# Patient Record
Sex: Female | Born: 1985 | Race: White | Hispanic: No | Marital: Married | Smoking: Former smoker
Health system: Southern US, Community
[De-identification: ages and names within clinical notes are randomized; demographics above are authoritative.]

## PROBLEM LIST (undated history)

## (undated) DIAGNOSIS — F32A Depression, unspecified: Secondary | ICD-10-CM

## (undated) DIAGNOSIS — J45909 Unspecified asthma, uncomplicated: Secondary | ICD-10-CM

## (undated) DIAGNOSIS — S3720XA Unspecified injury of bladder, initial encounter: Secondary | ICD-10-CM

## (undated) DIAGNOSIS — K259 Gastric ulcer, unspecified as acute or chronic, without hemorrhage or perforation: Secondary | ICD-10-CM

## (undated) DIAGNOSIS — T8859XA Other complications of anesthesia, initial encounter: Secondary | ICD-10-CM

## (undated) DIAGNOSIS — F419 Anxiety disorder, unspecified: Secondary | ICD-10-CM

## (undated) DIAGNOSIS — Z87440 Personal history of urinary (tract) infections: Secondary | ICD-10-CM

## (undated) DIAGNOSIS — K219 Gastro-esophageal reflux disease without esophagitis: Secondary | ICD-10-CM

## (undated) HISTORY — DX: Gastric ulcer, unspecified as acute or chronic, without hemorrhage or perforation: K25.9

## (undated) HISTORY — DX: Anxiety disorder, unspecified: F41.9

## (undated) HISTORY — DX: Unspecified asthma, uncomplicated: J45.909

## (undated) HISTORY — PX: PARTIAL HYSTERECTOMY: SHX80

## (undated) HISTORY — PX: CHOLECYSTECTOMY: SHX55

## (undated) HISTORY — DX: Depression, unspecified: F32.A

## (undated) HISTORY — DX: Gastro-esophageal reflux disease without esophagitis: K21.9

## (undated) HISTORY — PX: APPENDECTOMY: SHX54

## (undated) HISTORY — PX: TONSILLECTOMY: SUR1361

---

## 2017-02-12 HISTORY — PX: BLADDER REPAIR: SHX76

## 2018-07-16 ENCOUNTER — Ambulatory Visit (INDEPENDENT_AMBULATORY_CARE_PROVIDER_SITE_OTHER): Payer: Medicaid Other | Admitting: Internal Medicine

## 2018-07-16 ENCOUNTER — Encounter (INDEPENDENT_AMBULATORY_CARE_PROVIDER_SITE_OTHER): Payer: Self-pay | Admitting: Internal Medicine

## 2018-07-16 ENCOUNTER — Other Ambulatory Visit: Payer: Self-pay

## 2018-07-16 VITALS — BP 102/72 | HR 74 | Temp 98.5°F | Ht 65.0 in | Wt 209.2 lb

## 2018-07-16 DIAGNOSIS — A048 Other specified bacterial intestinal infections: Secondary | ICD-10-CM | POA: Insufficient documentation

## 2018-07-16 DIAGNOSIS — K219 Gastro-esophageal reflux disease without esophagitis: Secondary | ICD-10-CM | POA: Diagnosis not present

## 2018-07-16 MED ORDER — OMEPRAZOLE 40 MG PO CPDR: 40.0000 mg | DELAYED_RELEASE_CAPSULE | Freq: Two times a day (BID) | ORAL | 3 refills | Status: DC

## 2018-07-16 NOTE — Patient Instructions (Signed)
EGD. The risks of bleeding, perforation and infection were reviewed with patient.  

## 2018-07-16 NOTE — Progress Notes (Signed)
   Subjective:    Patient ID: Angela Kent, female    DOB: [birth year], 33 y.o.   MRN: [id number]  HPI Referred by Angela Glatter PA-C for positive H. Pylori. She was covered with amoxicillin clarithromycin and omeprazole.  Saws Angela Kent 07/05/2018 with epigastric x 3 days.  Radiated into back. Nausea but no vomiting. Today she says the stomach pain is not as bad. She says she is having some dysphagia. She has an acid taste in her mouth. She has soreness in her esophagus. She does say her symptoms are better, though her esophagus is not better. Her appetite is okay. No weight loss. BMs move okay.  She does tell me that every since she had her hysterectomy last year, her esophagus has not felt right.     Review of Systems History reviewed. No pertinent past medical history.  History reviewed. No pertinent surgical history.  Allergies  Allergen Reactions  . Codeine     hives    Current Outpatient Medications on File Prior to Visit  Medication Sig Dispense Refill  . amoxicillin (AMOXIL) 500 MG tablet Take 500 mg by mouth 2 (two) times daily.    . clarithromycin (BIAXIN) 250 MG tablet Take 250 mg by mouth 2 (two) times daily.    Marland Kitchen omeprazole (PRILOSEC) 20 MG capsule Take 20 mg by mouth daily.     No current facility-administered medications on file prior to visit.         Objective:   Physical Exam Blood pressure 102/72, pulse 74, temperature 98.5 F (36.9 C), height 5\' 5"  (1.651 m), weight 209 lb 3.2 oz (94.9 kg). Alert and oriented. Skin warm and dry. Oral mucosa is moist.   . Sclera anicteric, conjunctivae is pink. Thyroid not enlarged. No cervical lymphadenopathy. Lungs clear. Heart regular rate and rhythm.  Abdomen is soft. Bowel sounds are positive. No hepatomegaly. No abdominal masses felt. Slight epigastric tenderness.  No edema to lower extremities.          Assessment & Plan:  GERD. H. Pylori.  Will need an EGD. The risks of bleeding, perforation and infection were reviewed  with patient.

## 2018-07-17 ENCOUNTER — Other Ambulatory Visit (INDEPENDENT_AMBULATORY_CARE_PROVIDER_SITE_OTHER): Payer: Self-pay | Admitting: Internal Medicine

## 2018-07-17 ENCOUNTER — Encounter (INDEPENDENT_AMBULATORY_CARE_PROVIDER_SITE_OTHER): Payer: Self-pay | Admitting: *Deleted

## 2018-07-17 DIAGNOSIS — K219 Gastro-esophageal reflux disease without esophagitis: Secondary | ICD-10-CM | POA: Insufficient documentation

## 2018-07-17 DIAGNOSIS — A048 Other specified bacterial intestinal infections: Secondary | ICD-10-CM

## 2018-07-17 NOTE — Progress Notes (Signed)
EGD

## 2018-09-23 ENCOUNTER — Other Ambulatory Visit (HOSPITAL_COMMUNITY)
Admission: RE | Admit: 2018-09-23 | Discharge: 2018-09-23 | Disposition: A | Discharge status: Home / Self Care | Payer: Medicaid Other | Source: Ambulatory Visit | Attending: Internal Medicine | Admitting: Internal Medicine

## 2018-09-23 ENCOUNTER — Other Ambulatory Visit: Payer: Self-pay

## 2018-09-23 DIAGNOSIS — Z01812 Encounter for preprocedural laboratory examination: Secondary | ICD-10-CM | POA: Diagnosis not present

## 2018-09-23 DIAGNOSIS — Z20828 Contact with and (suspected) exposure to other viral communicable diseases: Secondary | ICD-10-CM | POA: Diagnosis not present

## 2018-09-23 LAB — SARS CORONAVIRUS 2 (TAT 6-24 HRS): SARS Coronavirus 2: NEGATIVE

## 2018-09-25 ENCOUNTER — Ambulatory Visit (HOSPITAL_COMMUNITY)
Admission: RE | Admit: 2018-09-25 | Discharge: 2018-09-25 | Disposition: A | Discharge status: Home / Self Care | Payer: Medicaid Other | Attending: Internal Medicine | Admitting: Internal Medicine

## 2018-09-25 ENCOUNTER — Other Ambulatory Visit: Payer: Self-pay

## 2018-09-25 ENCOUNTER — Encounter (HOSPITAL_COMMUNITY): Payer: Self-pay | Admitting: *Deleted

## 2018-09-25 ENCOUNTER — Encounter (HOSPITAL_COMMUNITY)
Admission: RE | Disposition: A | Discharge status: Home / Self Care | Payer: Self-pay | Source: Home / Self Care | Attending: Internal Medicine

## 2018-09-25 DIAGNOSIS — A048 Other specified bacterial intestinal infections: Secondary | ICD-10-CM

## 2018-09-25 DIAGNOSIS — Z885 Allergy status to narcotic agent status: Secondary | ICD-10-CM | POA: Diagnosis not present

## 2018-09-25 DIAGNOSIS — K319 Disease of stomach and duodenum, unspecified: Secondary | ICD-10-CM | POA: Diagnosis not present

## 2018-09-25 DIAGNOSIS — Z9049 Acquired absence of other specified parts of digestive tract: Secondary | ICD-10-CM | POA: Diagnosis not present

## 2018-09-25 DIAGNOSIS — R12 Heartburn: Secondary | ICD-10-CM | POA: Diagnosis not present

## 2018-09-25 DIAGNOSIS — K228 Other specified diseases of esophagus: Secondary | ICD-10-CM | POA: Insufficient documentation

## 2018-09-25 DIAGNOSIS — K219 Gastro-esophageal reflux disease without esophagitis: Secondary | ICD-10-CM | POA: Diagnosis not present

## 2018-09-25 DIAGNOSIS — Z8619 Personal history of other infectious and parasitic diseases: Secondary | ICD-10-CM | POA: Insufficient documentation

## 2018-09-25 DIAGNOSIS — E669 Obesity, unspecified: Secondary | ICD-10-CM | POA: Insufficient documentation

## 2018-09-25 DIAGNOSIS — K2970 Gastritis, unspecified, without bleeding: Secondary | ICD-10-CM | POA: Diagnosis not present

## 2018-09-25 DIAGNOSIS — F17210 Nicotine dependence, cigarettes, uncomplicated: Secondary | ICD-10-CM | POA: Insufficient documentation

## 2018-09-25 DIAGNOSIS — R1013 Epigastric pain: Secondary | ICD-10-CM | POA: Diagnosis not present

## 2018-09-25 DIAGNOSIS — Z79899 Other long term (current) drug therapy: Secondary | ICD-10-CM | POA: Diagnosis not present

## 2018-09-25 DIAGNOSIS — Z7982 Long term (current) use of aspirin: Secondary | ICD-10-CM | POA: Diagnosis not present

## 2018-09-25 HISTORY — PX: ESOPHAGOGASTRODUODENOSCOPY: SHX5428

## 2018-09-25 HISTORY — PX: BIOPSY: SHX5522

## 2018-09-25 SURGERY — EGD (ESOPHAGOGASTRODUODENOSCOPY)
Anesthesia: Moderate Sedation

## 2018-09-25 MED ORDER — MEPERIDINE HCL 50 MG/ML IJ SOLN
INTRAMUSCULAR | Status: DC | PRN
  Administered 2018-09-25 (×2): 25 mg via INTRAVENOUS

## 2018-09-25 MED ORDER — MEPERIDINE HCL 50 MG/ML IJ SOLN
INTRAMUSCULAR | Status: AC
  Filled 2018-09-25: qty 1

## 2018-09-25 MED ORDER — MIDAZOLAM HCL 5 MG/5ML IJ SOLN
INTRAMUSCULAR | Status: AC
  Filled 2018-09-25: qty 10

## 2018-09-25 MED ORDER — MIDAZOLAM HCL 5 MG/5ML IJ SOLN
INTRAMUSCULAR | Status: DC | PRN
  Administered 2018-09-25 (×5): 2 mg via INTRAVENOUS

## 2018-09-25 MED ORDER — LIDOCAINE VISCOUS HCL 2 % MT SOLN
OROMUCOSAL | Status: DC | PRN
  Administered 2018-09-25: 1 via OROMUCOSAL

## 2018-09-25 MED ORDER — PANTOPRAZOLE SODIUM 40 MG PO TBEC: 40.0000 mg | DELAYED_RELEASE_TABLET | Freq: Every day | ORAL | 5 refills | Status: DC

## 2018-09-25 MED ORDER — LIDOCAINE VISCOUS HCL 2 % MT SOLN
OROMUCOSAL | Status: AC
  Filled 2018-09-25: qty 15

## 2018-09-25 MED ORDER — SODIUM CHLORIDE 0.9 % IV SOLN
INTRAVENOUS | Status: DC
  Administered 2018-09-25: 11:00:00 via INTRAVENOUS

## 2018-09-25 NOTE — Op Note (Signed)
[facility] Patient Name: Angela Kent Procedure Date: 09/25/2018 11:49 AM MRN: [id number] Date of Birth: 1985/05/03 Attending MD: Hildred Laser , MD CSN: [id number] Age: 33 Admit Type: Outpatient Procedure:                Upper GI endoscopy Indications:              Epigastric abdominal pain, Heartburn Providers:                Hildred Laser, MD, Charlsie Quest. Theda Sers RN, RN, Aram Candela Referring MD:              Medicines:                Lidocaine spray, Meperidine 50 mg IV, Midazolam 10                            mg IV Complications:            No immediate complications. Estimated Blood Loss:     Estimated blood loss was minimal. Procedure:                Pre-Anesthesia Assessment:                           - Prior to the procedure, a History and Physical                            was performed, and patient medications and                            allergies were reviewed. The patient's tolerance of                            previous anesthesia was also reviewed. The risks                            and benefits of the procedure and the sedation                            options and risks were discussed with the patient.                            All questions were answered, and informed consent                            was obtained. Prior Anticoagulants: The patient has                            taken no previous anticoagulant or antiplatelet                            agents except for aspirin. ASA Grade Assessment: I                            -  A normal, healthy patient. After reviewing the                            risks and benefits, the patient was deemed in                            satisfactory condition to undergo the procedure.                           After obtaining informed consent, the endoscope was                            passed under direct vision. Throughout the                            procedure, the patient's blood  pressure, pulse, and                            oxygen saturations were monitored continuously. The                            [id number]-[id number] ([id number] scope was introduced through the                            mouth, and advanced to the second part of duodenum.                            The upper GI endoscopy was accomplished without                            difficulty. The patient tolerated the procedure                            well. Scope In: 12:49:53 PM Scope Out: 12:57:02 PM Total Procedure Duration: 0 hours 7 minutes 9 seconds  Findings:      The examined esophagus was normal.      The Z-line was irregular and was found 36 cm from the incisors.      Patchy moderate inflammation characterized by congestion (edema),       erosions and erythema was found in the gastric antrum and in the       prepyloric region of the stomach. Biopsies were taken with a cold       forceps for histology.      The exam of the stomach was otherwise normal.      The duodenal bulb and second portion of the duodenum were normal. Impression:               - Normal esophagus.                           - Z-line irregular, 36 cm from the incisors.                           - Gastritis. Biopsied.                           -  Normal duodenal bulb and second portion of the                            duodenum. Moderate Sedation:      Moderate (conscious) sedation was administered by the endoscopy nurse       and supervised by the endoscopist. The following parameters were       monitored: oxygen saturation, heart rate, blood pressure, CO2       capnography and response to care. Total physician intraservice time was       12 minutes. Recommendation:           - Patient has a contact number available for                            emergencies. The signs and symptoms of potential                            delayed complications were discussed with the                            patient. Return to normal activities  tomorrow.                            Written discharge instructions were provided to the                            patient.                           - Resume previous diet today.                           - Continue present medications.Keep NSAID use to                            minimum.                           - Pantoprazole 40 mg po qam.                           - Await pathology results. Procedure Code(s):        --- Professional ---                           [phone], Esophagogastroduodenoscopy, flexible,                            transoral; with biopsy, single or multiple                           G0500, Moderate sedation services provided by the                            same physician or other qualified health care  professional performing a gastrointestinal                            endoscopic service that sedation supports,                            requiring the presence of an independent trained                            observer to assist in the monitoring of the                            patient's level of consciousness and physiological                            status; initial 15 minutes of intra-service time;                            patient age 88 years or older (additional time may                            be reported with [id number], as appropriate) Diagnosis Code(s):        --- Professional ---                           K22.8, Other specified diseases of esophagus                           K29.70, Gastritis, unspecified, without bleeding                           R10.13, Epigastric pain                           R12, Heartburn CPT copyright 2019 American Medical Association. All rights reserved. The codes documented in this report are preliminary and upon coder review may  be revised to meet current compliance requirements. Lionel DecemberNajeeb Leyla Soliz, MD Lionel DecemberNajeeb Sofia Jaquith, MD 09/25/2018 1:07:08 PM This report has been signed electronically. Number  of Addenda: 0

## 2018-09-25 NOTE — Discharge Instructions (Signed)
No aspirin or NSAIDs for 24 hours. Keep BC powder use to minimum. Take Tylenol up to 2 g/day on as-needed basis for headache. Pantoprazole 40 mg by mouth 30 minutes before breakfast daily. No driving for 24 hours. Physician  will call with biopsy results.   Upper Endoscopy, Adult, Care After This sheet gives you information about how to care for yourself after your procedure. Your health care provider may also give you more specific instructions. If you have problems or questions, contact your health care provider. What can I expect after the procedure? After the procedure, it is common to have:  A sore throat.  Mild stomach pain or discomfort.  Bloating.  Nausea. Follow these instructions at home:   Follow instructions from your health care provider about what to eat or drink after your procedure.  Return to your normal activities as told by your health care provider. Ask your health care provider what activities are safe for you.  Take over-the-counter and prescription medicines only as told by your health care provider.  Do not drive for 24 hours if you were given a sedative during your procedure.  Keep all follow-up visits as told by your health care provider. This is important. Contact a health care provider if you have:  A sore throat that lasts longer than one day.  Trouble swallowing. Get help right away if:  You vomit blood or your vomit looks like coffee grounds.  You have: ? A fever. ? Bloody, black, or tarry stools. ? A severe sore throat or you cannot swallow. ? Difficulty breathing. ? Severe pain in your chest or abdomen. Summary  After the procedure, it is common to have a sore throat, mild stomach discomfort, bloating, and nausea.  Do not drive for 24 hours if you were given a sedative during the procedure.  Follow instructions from your health care provider about what to eat or drink after your procedure.  Return to your normal activities as  told by your health care provider. This information is not intended to replace advice given to you by your health care provider. Make sure you discuss any questions you have with your health care provider. Document Released: 07/31/2011 Document Revised: 07/23/2017 Document Reviewed: 07/01/2017 Elsevier Patient Education  [place].  Gastritis, Adult  Gastritis is swelling (inflammation) of the stomach. Gastritis can develop quickly (acute). It can also develop slowly over time (chronic). It is important to get help for this condition. If you do not get help, your stomach can bleed, and you can get sores (ulcers) in your stomach. What are the causes? This condition may be caused by:  Germs that get to your stomach.  Drinking too much alcohol.  Medicines you are taking.  Too much acid in the stomach.  A disease of the intestines or stomach.  Stress.  An allergic reaction.  Crohn's disease.  Some cancer treatments (radiation). Sometimes the cause of this condition is not known. What are the signs or symptoms? Symptoms of this condition include:  Pain in your stomach.  A burning feeling in your stomach.  Feeling sick to your stomach (nauseous).  Throwing up (vomiting).  Feeling too full after you eat.  Weight loss.  Bad breath.  Throwing up blood.  Blood in your poop (stool). How is this diagnosed? This condition may be diagnosed with:  Your medical history and symptoms.  A physical exam.  Tests. These can include: ? Blood tests. ? Stool tests. ? A procedure to look  inside your stomach (upper endoscopy). ? A test in which a sample of tissue is taken for testing (biopsy). How is this treated? Treatment for this condition depends on what caused it. You may be given:  Antibiotic medicine, if your condition was caused by germs.  H2 blockers and similar medicines, if your condition was caused by too much acid. Follow these instructions at  home: Medicines  Take over-the-counter and prescription medicines only as told by your doctor.  If you were prescribed an antibiotic medicine, take it as told by your doctor. Do not stop taking it even if you start to feel better. Eating and drinking   Eat small meals often, instead of large meals.  Avoid foods and drinks that make your symptoms worse.  Drink enough fluid to keep your pee (urine) pale yellow. Alcohol use  Do not drink alcohol if: ? Your doctor tells you not to drink. ? You are pregnant, may be pregnant, or are planning to become pregnant.  If you drink alcohol: ? Limit your use to:  0-1 drink a day for women.  0-2 drinks a day for men. ? Be aware of how much alcohol is in your drink. In the [place] one drink equals one 12 oz bottle of beer (355 mL), one 5 oz glass of wine (148 mL), or one 1 oz glass of hard liquor (44 mL). General instructions  Talk with your doctor about ways to manage stress. You can exercise or do deep breathing, meditation, or yoga.  Do not smoke or use products that have nicotine or tobacco. If you need help quitting, ask your doctor.  Keep all follow-up visits as told by your doctor. This is important. Contact a doctor if:  Your symptoms get worse.  Your symptoms go away and then come back. Get help right away if:  You throw up blood or something that looks like coffee grounds.  You have black or dark red poop.  You throw up any time you try to drink fluids.  Your stomach pain gets worse.  You have a fever.  You do not feel better after one week. Summary  Gastritis is swelling (inflammation) of the stomach.  You must get help for this condition. If you do not get help, your stomach can bleed, and you can get sores (ulcers).  This condition is diagnosed with medical history, physical exam, or tests.  You can be treated with medicines for germs or medicines to block too much acid in your stomach. This information is not  intended to replace advice given to you by your health care provider. Make sure you discuss any questions you have with your health care provider. Document Released: 07/18/2007 Document Revised: 06/18/2017 Document Reviewed: 06/18/2017 Elsevier Patient Education  2020 [employer]

## 2018-09-25 NOTE — H&P (Signed)
Angela Kent is an 33 y.o. female.   Chief Complaint: Patient is here for EGD. HPI: Patient is 33 year old Caucasian female who has been experiencing epigastric pain over the last several weeks.  She has been tested and treated for H. pylori infection.  She is better but she still has pain.  Pain occurs when she is hungry and also after meals and may last for up to an hour.  Pain is not associated nausea vomiting melena or rectal bleeding.  Her appetite is good and she has not lost any weight.  She takes no more than 1 or 2 doses of Goody powders per week.  She has heartburn for 3 times a week and she is taking OTC NSAIDs.  She smokes 6 to 7 cigarettes/day.  She is trying to quit.  She does not drink alcohol but states she consume too much of caffeine-containing products.   Past medical history Obesity  Past Surgical History:  Procedure Laterality Date  . APPENDECTOMY    . CESAREAN SECTION    . CHOLECYSTECTOMY    . PARTIAL HYSTERECTOMY    . TONSILLECTOMY      History reviewed. No pertinent family history. Social History:  reports that she has been smoking. She has never used smokeless tobacco. She reports that she does not drink alcohol or use drugs.  Allergies:  Allergies  Allergen Reactions  . Codeine Hives    Medications Prior to Admission  Medication Sig Dispense Refill  . Aspirin-Acetaminophen-Caffeine (GOODY HEADACHE PO) Take 1 packet by mouth daily as needed (for headache).    . calcium carbonate (TUMS EX) 750 MG chewable tablet Chew 2 tablets by mouth 2 (two) times daily as needed for heartburn.      No results found for this or any previous visit (from the past 48 hour(s)). No results found.  ROS  Blood pressure 98/68, pulse 79, temperature 98.2 F (36.8 C), temperature source Oral, resp. rate 13, height 5\' 5"  (1.651 m), weight 90.7 kg, SpO2 99 %. Physical Exam  Constitutional: She appears well-developed and well-nourished.  HENT:  Mouth/Throat: Oropharynx is clear  and moist.  Eyes: Conjunctivae are normal. No scleral icterus.  Neck: No thyromegaly present.  GI:  Abdomen is full.  She has lower midline, appendectomy scar as well as 2 scars in upper abdomen.  Abdomen is soft and nontender with no organomegaly or masses.  Lymphadenopathy:    She has no cervical adenopathy.     Assessment/Plan Epigastric pain. History of H. pylori gastritis Diagnostic EGD.  Hildred Laser, MD 09/25/2018, 12:38 PM

## 2018-09-29 ENCOUNTER — Telehealth (INDEPENDENT_AMBULATORY_CARE_PROVIDER_SITE_OTHER): Payer: Self-pay | Admitting: Internal Medicine

## 2018-09-29 NOTE — Telephone Encounter (Signed)
Forwarded to Dr.Rehman. 

## 2018-09-29 NOTE — Telephone Encounter (Signed)
Patient called to get results from EGD done on 8-13  -  Ph# [phone]

## 2018-09-30 NOTE — Telephone Encounter (Signed)
Patient left message stating she spoke with you yesterday and would like a call back at [phone]

## 2018-09-30 NOTE — Telephone Encounter (Signed)
Patient call with biopsy results.

## 2018-10-02 NOTE — Telephone Encounter (Signed)
Provided phone number not functioning, pls see if there is an alternative phone #

## 2018-10-02 NOTE — Telephone Encounter (Signed)
I called provided phone number, call could not be completed, automated msg stated to try calling again later

## 2018-10-03 ENCOUNTER — Encounter (HOSPITAL_COMMUNITY): Payer: Self-pay | Admitting: Internal Medicine

## 2019-01-01 ENCOUNTER — Ambulatory Visit (INDEPENDENT_AMBULATORY_CARE_PROVIDER_SITE_OTHER): Payer: Medicaid Other | Admitting: Nurse Practitioner

## 2019-02-17 ENCOUNTER — Encounter (INDEPENDENT_AMBULATORY_CARE_PROVIDER_SITE_OTHER): Payer: Self-pay

## 2019-02-17 ENCOUNTER — Telehealth (INDEPENDENT_AMBULATORY_CARE_PROVIDER_SITE_OTHER): Payer: Self-pay | Admitting: Internal Medicine

## 2019-02-17 NOTE — Telephone Encounter (Signed)
Patient called about an appointment - was set up for the next available - stated she needs something for her Hpylori - please advise - [phone]

## 2019-02-17 NOTE — Telephone Encounter (Signed)
Dr.Rehman ask if Liborio Nixon could see the patient this Thursday, February 19 2019.

## 2019-02-19 ENCOUNTER — Ambulatory Visit (INDEPENDENT_AMBULATORY_CARE_PROVIDER_SITE_OTHER): Payer: Medicaid Other | Admitting: Gastroenterology

## 2019-02-19 ENCOUNTER — Other Ambulatory Visit: Payer: Self-pay

## 2019-02-19 ENCOUNTER — Encounter (INDEPENDENT_AMBULATORY_CARE_PROVIDER_SITE_OTHER): Payer: Self-pay | Admitting: Gastroenterology

## 2019-02-19 VITALS — BP 103/73 | HR 79 | Temp 97.3°F | Ht 60.0 in | Wt 208.1 lb

## 2019-02-19 DIAGNOSIS — Z8619 Personal history of other infectious and parasitic diseases: Secondary | ICD-10-CM | POA: Diagnosis not present

## 2019-02-19 DIAGNOSIS — R1013 Epigastric pain: Secondary | ICD-10-CM

## 2019-02-19 LAB — CBC WITH DIFFERENTIAL/PLATELET
Absolute Monocytes: 515 cells/uL (ref 200–950)
Basophils Absolute: 33 cells/uL (ref 0–200)
Basophils Relative: 0.4 %
Eosinophils Absolute: 299 cells/uL (ref 15–500)
Eosinophils Relative: 3.6 %
HCT: 45.9 % — ABNORMAL HIGH (ref 35.0–45.0)
Hemoglobin: 15.8 g/dL — ABNORMAL HIGH (ref 11.7–15.5)
Lymphs Abs: 2324 cells/uL (ref 850–3900)
MCH: 30.2 pg (ref 27.0–33.0)
MCHC: 34.4 g/dL (ref 32.0–36.0)
MCV: 87.8 fL (ref 80.0–100.0)
MPV: 10.6 fL (ref 7.5–12.5)
Monocytes Relative: 6.2 %
Neutro Abs: 5129 cells/uL (ref 1500–7800)
Neutrophils Relative %: 61.8 %
Platelets: 247 10*3/uL (ref 140–400)
RBC: 5.23 10*6/uL — ABNORMAL HIGH (ref 3.80–5.10)
RDW: 12.6 % (ref 11.0–15.0)
Total Lymphocyte: 28 %
WBC: 8.3 10*3/uL (ref 3.8–10.8)

## 2019-02-19 LAB — COMPLETE METABOLIC PANEL WITH GFR
AG Ratio: 1.4 (calc) (ref 1.0–2.5)
ALT: 28 U/L (ref 6–29)
AST: 34 U/L — ABNORMAL HIGH (ref 10–30)
Albumin: 4.1 g/dL (ref 3.6–5.1)
Alkaline phosphatase (APISO): 34 U/L (ref 31–125)
BUN: 14 mg/dL (ref 7–25)
CO2: 26 mmol/L (ref 20–32)
Calcium: 9.4 mg/dL (ref 8.6–10.2)
Chloride: 106 mmol/L (ref 98–110)
Creat: 0.58 mg/dL (ref 0.50–1.10)
GFR, Est African American: 142 mL/min/{1.73_m2} (ref >=60)
GFR, Est Non African American: 123 mL/min/{1.73_m2} (ref >=60)
Globulin: 3 g/dL (calc) (ref 1.9–3.7)
Glucose, Bld: 86 mg/dL (ref 65–99)
Potassium: 4.2 mmol/L (ref 3.5–5.3)
Sodium: 140 mmol/L (ref 135–146)
Total Bilirubin: 0.6 mg/dL (ref 0.2–1.2)
Total Protein: 7.1 g/dL (ref 6.1–8.1)

## 2019-02-19 LAB — AMYLASE: Amylase: 41 U/L (ref 21–101)

## 2019-02-19 LAB — LIPASE: Lipase: 17 U/L (ref 7–60)

## 2019-02-19 MED ORDER — PANTOPRAZOLE SODIUM 40 MG PO TBEC: 40.0000 mg | DELAYED_RELEASE_TABLET | Freq: Two times a day (BID) | ORAL | 3 refills | Status: DC

## 2019-02-19 NOTE — Progress Notes (Addendum)
Patient profile: Angela Kent is a 34 y.o. female seen for evaluation of abdominal pain. Last seen in clinic on August 2020 for EGD.  She was diagnosed with H. pylori in May 2020, she was treated with triple therapy with amoxicillin clindamycin PPI.   She had an EGD in August without evidence of  H. Pylori(antral biopsy negative for H. Pylori).  History of Present Illness: Angela Kent is seen today for evaluation of epigastric pain.  She reports pain is very similar to pain she had last May when she was diagnosed with H. pylori and she is concerned about recurrence.  Reports 6 days ago she developed severe pain in her epigastric area, initially was a burning now is more of a dull aching.  Usually epigastric but can be worse on the right side.  Eating immediately makes pain worse.  Associated with nausea initially and one episode of vomiting but no vomiting over the past couple days.  She has been eating a very bland diet.  She is not having a lot of GERD symptoms.  She does note that prior to onset of pain she was using Goody's every 2 to 3 days for headaches.  Does try and take these with food.  She does not drink alcohol.  She does smoke 1/2 pack a day.  States she only drinks sodas and has no water intake.  Feels pain is significant enough she is having take some leftover Percocet from a prior surgery once a day to eat any foods  Her bowels are fairly regular about every 2 days.  She denies any blood in stool or black stool.  No lower abdominal pain.  Wt Readings from Last 3 Encounters:  02/19/19 208 lb 1.6 oz (94.4 kg)  09/25/18 200 lb (90.7 kg)  07/16/18 209 lb 3.2 oz (94.9 kg)     Last Colonoscopy: None prior Last Endoscopy: 09/2018-EGD normal esophagus, gastritis pathology with no H. pylori.   Past Medical History:  History reviewed. No pertinent past medical history.  Problem List: Patient Active Problem List   Diagnosis Date Noted  . Gastroesophageal reflux disease without  esophagitis 07/17/2018  . GERD (gastroesophageal reflux disease) 07/16/2018  . H. pylori infection 07/16/2018    Past Surgical History: Past Surgical History:  Procedure Laterality Date  . APPENDECTOMY    . BIOPSY  09/25/2018   Procedure: BIOPSY;  Surgeon: Malissa Hippo, MD;  Location: [facility];  Service: Endoscopy;;  . CESAREAN SECTION    . CHOLECYSTECTOMY    . ESOPHAGOGASTRODUODENOSCOPY N/A 09/25/2018   Procedure: ESOPHAGOGASTRODUODENOSCOPY (EGD);  Surgeon: Malissa Hippo, MD;  Location: [facility];  Service: Endoscopy;  Laterality: N/A;  1200  . PARTIAL HYSTERECTOMY    . TONSILLECTOMY      Allergies: Allergies  Allergen Reactions  . Codeine Hives      Home Medications:  Current Outpatient Medications:  .  Aspirin-Acetaminophen-Caffeine (GOODY HEADACHE PO), Take 1 packet by mouth daily as needed (for headache)., Disp: , Rfl:  .  calcium carbonate (TUMS EX) 750 MG chewable tablet, Chew 2 tablets by mouth 2 (two) times daily as needed for heartburn., Disp: , Rfl:  .  pantoprazole (PROTONIX) 40 MG tablet, Take 1 tablet (40 mg total) by mouth 2 (two) times daily. Take 30 min before breakfast and supper, Disp: 90 tablet, Rfl: 3   Family History:  Dad's sister colon cancer - late 33s   Mother-58-stomach ulcers, "colitis",   Father -unknown health  Social History:   reports that she has been smoking. She has never used smokeless tobacco. She reports that she does not drink alcohol or use drugs.   Review of Systems: Constitutional: Denies weight loss/weight gain  Eyes: No changes in vision. ENT: No oral lesions, sore throat.  GI: see HPI.  Heme/Lymph: No easy bruising.  CV: No chest pain.  GU: No hematuria.  Integumentary: No rashes.  Neuro: No headaches.  Psych: No depression/anxiety.  Endocrine: No heat/cold intolerance.  Allergic/Immunologic: No urticaria.  Resp: No cough, SOB.  Musculoskeletal: No joint swelling.    Physical Examination: BP 103/73  (BP Location: Right Arm, Patient Position: Sitting, Cuff Size: Large)   Pulse 79   Temp (!) 97.3 F (36.3 C) (Temporal)   Ht 5' (1.524 m)   Wt 208 lb 1.6 oz (94.4 kg)   BMI 40.64 kg/m  Gen: NAD, alert and oriented x 4 HEENT: PEERLA, EOMI, Neck: supple, no JVD Chest: CTA bilaterally, no wheezes, crackles, or other adventitious sounds CV: RRR, no m/g/c/r Abd: soft, diffuse mild TTP throughout entire abd, ND, +BS in all four quadrants; no HSM, guarding, ridigity, or rebound tenderness Ext: no edema, well perfused with 2+ pulses, Skin: no rash or lesions noted on observed skin Lymph: no noted LAD  Data Reviewed:  No recent labs available in epic or Care Everywhere.   Assessment/Plan: Ms. Casa is a 34 y.o. female seen today for epigastric pain  1.  Epigastric pain-suspect likely gastritis, peptic ulcer disease, etc.  She does have some NSAID use as well as tobacco.  We discussed modifying her risk factors and she has stopped Goodys powders since pain began.  We will check basic labs as above to exclude biliary pancreatic etiology.  We will start her on PPI twice daily.  She has been treated for H. pylori and in past and did not have H. pylori identified on endoscopy pathology but she is concerned for her current symptoms and will check a stool antigen prior to PPI start for eval.   If she is doing well follow-up in office in 1 month to wean down PPI.  She will contact us sooner if needed.   Citlaly was seen today for follow-up.  Diagnoses and all orders for this visit:  Abdominal pain, epigastric -     H. pylori breath test -     CBC with Differential -     CMP -     Amylase -     Lipase  History of Helicobacter pylori infection -     H. pylori breath test -     CBC with Differential -     CMP -     Amylase -     Lipase  Other orders -     pantoprazole (PROTONIX) 40 MG tablet; Take 1 tablet (40 mg total) by mouth 2 (two) times daily. Take 30 min before breakfast and  supper        I personally performed the service, non-incident to. (WP)  Laurine Blazer, [facility]

## 2019-02-19 NOTE — Patient Instructions (Signed)
We are starting you on medication for stomach ulcers and checking labs.  Take Protonix twice a day 30 minutes before meals.  Make sure you are avoiding Goody's and BCs.  Okay to use Tylenol for headaches.  Bland diet as we discussed.  Try to decrease tobacco intake and increase water intake.  We will call with lab results.

## 2019-03-18 ENCOUNTER — Encounter (INDEPENDENT_AMBULATORY_CARE_PROVIDER_SITE_OTHER): Payer: Self-pay

## 2019-03-19 ENCOUNTER — Ambulatory Visit (INDEPENDENT_AMBULATORY_CARE_PROVIDER_SITE_OTHER): Payer: Medicaid Other | Admitting: Gastroenterology

## 2019-03-23 ENCOUNTER — Ambulatory Visit (INDEPENDENT_AMBULATORY_CARE_PROVIDER_SITE_OTHER): Payer: Medicaid Other | Admitting: Gastroenterology

## 2019-08-20 ENCOUNTER — Encounter (INDEPENDENT_AMBULATORY_CARE_PROVIDER_SITE_OTHER): Payer: Self-pay | Admitting: Gastroenterology

## 2019-08-20 ENCOUNTER — Other Ambulatory Visit: Payer: Self-pay

## 2019-08-20 ENCOUNTER — Ambulatory Visit (INDEPENDENT_AMBULATORY_CARE_PROVIDER_SITE_OTHER): Payer: Medicaid Other | Admitting: Gastroenterology

## 2019-08-20 VITALS — BP 96/67 | HR 79 | Temp 98.8°F | Ht 65.0 in | Wt 209.9 lb

## 2019-08-20 DIAGNOSIS — K219 Gastro-esophageal reflux disease without esophagitis: Secondary | ICD-10-CM

## 2019-08-20 DIAGNOSIS — R1013 Epigastric pain: Secondary | ICD-10-CM

## 2019-08-20 MED ORDER — DICYCLOMINE HCL 10 MG PO CAPS: 10.0000 mg | ORAL_CAPSULE | Freq: Three times a day (TID) | ORAL | 1 refills | Status: DC | PRN

## 2019-08-20 MED ORDER — SUCRALFATE 1 G PO TABS: 1.0000 g | ORAL_TABLET | Freq: Two times a day (BID) | ORAL | 6 refills | Status: DC

## 2019-08-20 NOTE — Patient Instructions (Signed)
Try carafate twice a day before meals - this coats stomach and can helps ulcers/gastritis heal.  Can try dicyclomine as needed if pain severe

## 2019-08-20 NOTE — Progress Notes (Signed)
Patient profile: Angela Kent is a 34 y.o. female seen for follow-up, last seen in clinic January 2021 for epigastric pain.  At that visit she had labs & was started on Protonix twice daily.  Epigastric pain felt related to heavy NSAID use at that time.  History of Present Illness: Angela Kent is seen today and reports continued symptoms of intermittent epigastric pain. Describes a dull, achy, squeezing pain. Can occur immediately during meals or be worse if doesn't eat for long periods. She has tried OTC prilosec then prilosec 40mg  BID. Pain can last for a few weeks and then resolve a few weeks then return. She didn't find improvement w/ protonix bid at last OV. She denies nausea/vomiting. Denies any GERD symptoms w/ the pain.   She reports regular bowel habits, loose since gallbladder was removed if eats greasy foods. She denies blood in stool. No melena unless using pepto. Pepto does not help the epigastric pain.   Patient is no longer using goody's powder daily. Now only nsaid is motrin once a week on average. She smokes 5-6 cig/day. Denies frequent alcohol.     Wt Readings from Last 3 Encounters:  08/20/19 209 lb 14.4 oz (95.2 kg)  02/19/19 208 lb 1.6 oz (94.4 kg)  09/25/18 200 lb (90.7 kg)      09/2018-EGD normal esophagus, gastritis pathology with no H. pylori.   Past Medical History:  History reviewed. No pertinent past medical history.  Problem List: Patient Active Problem List   Diagnosis Date Noted  . Gastroesophageal reflux disease without esophagitis 07/17/2018  . GERD (gastroesophageal reflux disease) 07/16/2018  . H. pylori infection 07/16/2018    Past Surgical History: Past Surgical History:  Procedure Laterality Date  . APPENDECTOMY    . BIOPSY  09/25/2018   Procedure: BIOPSY;  Surgeon: 09/27/2018, MD;  [facility];  Service: Endoscopy;;  . CESAREAN SECTION    . CHOLECYSTECTOMY    . ESOPHAGOGASTRODUODENOSCOPY N/A 09/25/2018   Procedure:  ESOPHAGOGASTRODUODENOSCOPY (EGD);  Surgeon: 09/27/2018, MD;  [facility];  Service: Endoscopy;  Laterality: N/A;  1200  . PARTIAL HYSTERECTOMY    . TONSILLECTOMY      Allergies: Allergies  Allergen Reactions  . Codeine Hives      Home Medications:  Current Outpatient Medications:  .  Aspirin-Acetaminophen-Caffeine (GOODY HEADACHE PO), Take 1 packet by mouth daily as needed (for headache)., Disp: , Rfl:  .  calcium carbonate (TUMS EX) 750 MG chewable tablet, Chew 2 tablets by mouth 2 (two) times daily as needed for heartburn., Disp: , Rfl:  .  escitalopram (LEXAPRO) 10 MG tablet, Take 10 mg by mouth daily. , Disp: , Rfl:  .  hydrOXYzine (VISTARIL) 25 MG capsule, Take 25 mg by mouth as needed. , Disp: , Rfl:  .  pantoprazole (PROTONIX) 40 MG tablet, Take 1 tablet (40 mg total) by mouth 2 (two) times daily. Take 30 min before breakfast and supper, Disp: 90 tablet, Rfl: 3 .  dicyclomine (BENTYL) 10 MG capsule, Take 1 capsule (10 mg total) by mouth 3 (three) times daily as needed (abd pain)., Disp: 60 capsule, Rfl: 1 .  sucralfate (CARAFATE) 1 g tablet, Take 1 tablet (1 g total) by mouth 2 (two) times daily before a meal., Disp: 60 tablet, Rfl: 6   Family History: Mother has "type of colitis".  Dad is s/p CCY.   Social History:   reports that she has been smoking. She has never  used smokeless tobacco. She reports that she does not drink alcohol and does not use drugs.   Review of Systems: Constitutional: Denies weight loss/weight gain  Eyes: No changes in vision. ENT: No oral lesions, sore throat.  GI: see HPI.  Heme/Lymph: No easy bruising.  CV: No chest pain.  GU: No hematuria.  Integumentary: No rashes.  Neuro: No headaches.  Psych: No depression/anxiety.  Endocrine: No heat/cold intolerance.  Allergic/Immunologic: No urticaria.  Resp: No cough, SOB.  Musculoskeletal: No joint swelling.    Physical Examination: BP 96/67 (BP Location: Left Arm, Patient  Position: Sitting, Cuff Size: Normal)   Pulse 79   Temp 98.8 F (37.1 C) (Oral)   Ht 5\' 5"  (1.651 m)   Wt 209 lb 14.4 oz (95.2 kg)   BMI 34.93 kg/m  Gen: NAD, alert and oriented x 4 HEENT: PEERLA, EOMI, Neck: supple, no JVD Chest: CTA bilaterally, no wheezes, crackles, or other adventitious sounds CV: RRR, no m/g/c/r Abd: soft, NT, ND, +BS in all four quadrants; no HSM, guarding, ridigity, or rebound tenderness Ext: no edema, well perfused with 2+ pulses, Skin: no rash or lesions noted on observed skin Lymph: no noted LAD  Data Reviewed:  January 2021-lipase amylase CBC CMP unremarkable.   Assessment/Plan: Ms. Lewellyn is a 34 y.o. female seen for evaluation.   1. Epigastric pain - despite trials of 2 different PPIs without lasting improvement. She has had her gallbladder removed.  She had an endoscopy last year with gastritis.  She is no longer using NSAIDs.  Will try a course of Carafate.  If she does not improve with Carafate would consider repeat EGD versus abdominal imaging.  She had negative labs at her last visit.  Follow-up in 6 weeks to assess response to new medication-call sooner if needed   There are no diagnoses linked to this encounter.   Recommendations:   I personally performed the service, non-incident to. (WP)  38, [facility]

## 2019-10-14 ENCOUNTER — Ambulatory Visit (INDEPENDENT_AMBULATORY_CARE_PROVIDER_SITE_OTHER): Payer: Medicaid Other | Admitting: Gastroenterology

## 2019-11-04 ENCOUNTER — Other Ambulatory Visit: Payer: Self-pay | Admitting: Critical Care Medicine

## 2019-11-04 ENCOUNTER — Other Ambulatory Visit: Payer: Medicaid Other

## 2019-11-04 DIAGNOSIS — Z20822 Contact with and (suspected) exposure to covid-19: Secondary | ICD-10-CM

## 2019-11-06 LAB — SPECIMEN STATUS REPORT

## 2019-11-06 LAB — SARS-COV-2, NAA 2 DAY TAT

## 2019-11-06 LAB — NOVEL CORONAVIRUS, NAA: SARS-CoV-2, NAA: NOT DETECTED

## 2019-11-16 ENCOUNTER — Encounter (INDEPENDENT_AMBULATORY_CARE_PROVIDER_SITE_OTHER): Payer: Self-pay | Admitting: Gastroenterology

## 2019-11-16 ENCOUNTER — Ambulatory Visit (INDEPENDENT_AMBULATORY_CARE_PROVIDER_SITE_OTHER): Payer: Medicaid Other | Admitting: Gastroenterology

## 2019-11-16 ENCOUNTER — Other Ambulatory Visit: Payer: Self-pay

## 2019-11-16 VITALS — BP 103/73 | HR 69 | Temp 96.9°F | Ht 60.0 in | Wt 207.8 lb

## 2019-11-16 DIAGNOSIS — R1013 Epigastric pain: Secondary | ICD-10-CM | POA: Diagnosis not present

## 2019-11-16 DIAGNOSIS — R195 Other fecal abnormalities: Secondary | ICD-10-CM | POA: Diagnosis not present

## 2019-11-16 MED ORDER — HYOSCYAMINE SULFATE SL 0.125 MG SL SUBL: 0.1250 mg | SUBLINGUAL_TABLET | Freq: Two times a day (BID) | SUBLINGUAL | 3 refills | Status: DC | PRN

## 2019-11-16 NOTE — Patient Instructions (Signed)
Keep a food log when worst symptoms began - we are trying levsin. We are scheduling CT scan for evaluation

## 2019-11-16 NOTE — Progress Notes (Signed)
Patient profile: Angela Kent is a 34 y.o. female seen for f/up - last seen 08/2019 for epigastric pain.  She has a history of heavy NSAID use and was treated with Protonix 40 mg twice a day.  She did not see drastic improvement and added Carafate at last visit.  History of Present Illness: Angela Kent is seen today for follow-up.  She reports having continued symptoms since her last visit.  At last visit Carafate was added in addition to the Protonix.  She denies any improvement in her "ulcer-like pain" with adding Carafate.  She endorses pain is worse when her stomach emptying but the pain immediately worsens when she eats as well.  She has very mild nausea but no vomiting.  She denies any GERD symptoms with the Protonix. No dysphagia.  Bowels have been loose since her gallbladder surgery several years ago.  Typically has 2-3 a day but if she eats greasy food she can have up to 5 stools a day.  She had a brief episode of rectal bleeding a few weeks ago but she was given a hemorrhoid cream by her GYN and this has resolved it.  She denies straining w/ stools. Not currently have any rectal pain.   No NSAIDs since last OV. No alcohol. Smokes 8 cig/day.  Wt Readings from Last 3 Encounters:  11/16/19 207 lb 12.8 oz (94.3 kg)  08/20/19 209 lb 14.4 oz (95.2 kg)  02/19/19 208 lb 1.6 oz (94.4 kg)     09/2018-EGD normal esophagus, gastritis pathology with no H. pylori.   Past Medical History:  No past medical history on file.  Problem List: Patient Active Problem List   Diagnosis Date Noted  . Gastroesophageal reflux disease without esophagitis 07/17/2018  . GERD (gastroesophageal reflux disease) 07/16/2018  . H. pylori infection 07/16/2018    Past Surgical History: Past Surgical History:  Procedure Laterality Date  . APPENDECTOMY    . BIOPSY  09/25/2018   Procedure: BIOPSY;  Surgeon: Malissa Hippo, MD;  [facility];  Service: Endoscopy;;  . CESAREAN SECTION    .  CHOLECYSTECTOMY    . ESOPHAGOGASTRODUODENOSCOPY N/A 09/25/2018   Procedure: ESOPHAGOGASTRODUODENOSCOPY (EGD);  Surgeon: Malissa Hippo, MD;  [facility];  Service: Endoscopy;  Laterality: N/A;  1200  . PARTIAL HYSTERECTOMY    . TONSILLECTOMY      Allergies: Allergies  Allergen Reactions  . Codeine Hives      Home Medications:  Current Outpatient Medications:  .  escitalopram (LEXAPRO) 10 MG tablet, Take 10 mg by mouth daily. , Disp: , Rfl:  .  hydrOXYzine (VISTARIL) 25 MG capsule, Take 25 mg by mouth as needed. , Disp: , Rfl:  .  pantoprazole (PROTONIX) 40 MG tablet, Take 1 tablet (40 mg total) by mouth 2 (two) times daily. Take 30 min before breakfast and supper, Disp: 90 tablet, Rfl: 3 .  sucralfate (CARAFATE) 1 g tablet, Take 1 tablet (1 g total) by mouth 2 (two) times daily before a meal., Disp: 60 tablet, Rfl: 6 .  Aspirin-Acetaminophen-Caffeine (GOODY HEADACHE PO), Take 1 packet by mouth daily as needed (for headache). (Patient not taking: Reported on 11/16/2019), Disp: , Rfl:  .  calcium carbonate (TUMS EX) 750 MG chewable tablet, Chew 2 tablets by mouth 2 (two) times daily as needed for heartburn. (Patient not taking: Reported on 11/16/2019), Disp: , Rfl:  .  dicyclomine (BENTYL) 10 MG capsule, Take 1 capsule (10 mg total) by mouth  3 (three) times daily as needed (abd pain). (Patient not taking: Reported on 11/16/2019), Disp: 60 capsule, Rfl: 1 .  Hyoscyamine Sulfate SL (LEVSIN/SL) 0.125 MG SUBL, Place 0.125 mg under the tongue 2 (two) times daily as needed (diarrhea)., Disp: 60 tablet, Rfl: 3   Family History: Denies family history of colon polyps or colon cancer.  Social History:   reports that she has been smoking. She has never used smokeless tobacco. She reports that she does not drink alcohol and does not use drugs.   Review of Systems: Constitutional: Denies weight loss/weight gain  Eyes: No changes in vision. ENT: No oral lesions, sore throat.  GI: see  HPI.  Heme/Lymph: No easy bruising.  CV: No chest pain.  GU: No hematuria.  Integumentary: No rashes.  Neuro: No headaches.  Psych: No depression/anxiety.  Endocrine: No heat/cold intolerance.  Allergic/Immunologic: No urticaria.  Resp: No cough, SOB.  Musculoskeletal: No joint swelling.    Physical Examination: BP 103/73 (BP Location: Right Arm, Patient Position: Sitting, Cuff Size: Normal)   Pulse 69   Temp (!) 96.9 F (36.1 C) (Temporal)   Ht 5' (1.524 m)   Wt 207 lb 12.8 oz (94.3 kg)   BMI 40.58 kg/m  Gen: NAD, alert and oriented x 4 HEENT: PEERLA, EOMI, Neck: supple, no JVD Chest: CTA bilaterally, no wheezes, crackles, or other adventitious sounds CV: RRR, no m/g/c/r Abd: soft, NT, ND, +BS in all four quadrants; no HSM, guarding, ridigity, or rebound tenderness Ext: no edema, well perfused with 2+ pulses, Skin: no rash or lesions noted on observed skin Lymph: no noted LAD  Data Reviewed:   CT a/p 08/2017- Posterior bladder wall perforation with large amount of intraperitoneal extravasation of contrast. Trace pneumoperitoneum in the pelvis is likely postsurgical.   02/2019-lipase & amylase, CBC and CMP normal.   Care everywhere labs reviewed.  Assessment/Plan: Ms. Downie is a 34 y.o. female    Angela Kent was seen today for abdominal pain.  Diagnoses and all orders for this visit:  Epigastric pain -     CT Abdomen Pelvis W Contrast; Future  Loose stools  Other orders -     Hyoscyamine Sulfate SL (LEVSIN/SL) 0.125 MG SUBL; Place 0.125 mg under the tongue 2 (two) times daily as needed (diarrhea).   1. Epigastric pain -she has had endoscopy for evaluation.  She denies any improvement with Protonix 40 mg twice a day or carafate BID.  She believes she has tried dicyclomine without improvement but will send a course of Levsin to her pharmacy.  She notes that the upper abd pain began when she had a complicated hysterectomy in 2019 but she does not have lower abdominal pain.   Given persistence of pain will get imaging for further evaluation.  Further recs pending CT results.  She reports compliance with NSAID avoidance.  2.  Loose stools-ongoing since CCY, avoids greasy foods which worsen.  Can use Imodium if needed.  If worsens would also consider bile acid binder.  Follow-up pending CT results   I personally performed the service, non-incident to. (WP)  Tawni Pummel, [facility]

## 2019-11-21 ENCOUNTER — Other Ambulatory Visit: Payer: Medicaid Other

## 2019-11-21 ENCOUNTER — Other Ambulatory Visit: Payer: Self-pay

## 2019-11-21 DIAGNOSIS — Z20822 Contact with and (suspected) exposure to covid-19: Secondary | ICD-10-CM

## 2019-11-23 ENCOUNTER — Telehealth (INDEPENDENT_AMBULATORY_CARE_PROVIDER_SITE_OTHER): Payer: Self-pay | Admitting: Gastroenterology

## 2019-11-23 LAB — NOVEL CORONAVIRUS, NAA: SARS-CoV-2, NAA: NOT DETECTED

## 2019-11-23 LAB — SARS-COV-2, NAA 2 DAY TAT

## 2019-11-23 NOTE — Telephone Encounter (Signed)
Patient left voice mail message stating she hasn't heard anything about the scheduling of her CT scan - please advise - ph# [phone]

## 2019-11-23 NOTE — Telephone Encounter (Signed)
Angela Kent - can you check on this? Order is in visit encounter. Thanks.

## 2019-11-24 NOTE — Telephone Encounter (Signed)
Patient states she understands and that her symptoms are getting worse and she is having rectal bleeding

## 2019-11-24 NOTE — Telephone Encounter (Signed)
Noted - will forward to Ann to schedule once approved. Thanks.

## 2019-11-24 NOTE — Telephone Encounter (Signed)
Great thank you for the follow up Ann.   Soledad Gerlach - can you just notify patient Angela Kent is working on getting approval for this? Thanks.

## 2019-11-24 NOTE — Telephone Encounter (Signed)
PA has been submitted for CT - I spoke to patient at OV and made her aware this had to happen before I could schedule and sometimes with Medicaid it takes a while for this process

## 2019-12-02 ENCOUNTER — Ambulatory Visit (HOSPITAL_COMMUNITY)
Admission: RE | Admit: 2019-12-02 | Discharge: 2019-12-02 | Disposition: A | Discharge status: Home / Self Care | Payer: Medicaid Other | Source: Ambulatory Visit | Attending: Gastroenterology | Admitting: Gastroenterology

## 2019-12-02 ENCOUNTER — Other Ambulatory Visit: Payer: Self-pay

## 2019-12-02 DIAGNOSIS — R1013 Epigastric pain: Secondary | ICD-10-CM | POA: Diagnosis not present

## 2019-12-02 MED ORDER — IOHEXOL 300 MG/ML  SOLN
100.0000 mL | Freq: Once | INTRAMUSCULAR | Status: AC | PRN
  Administered 2019-12-02: 100 mL via INTRAVENOUS

## 2019-12-03 ENCOUNTER — Telehealth (INDEPENDENT_AMBULATORY_CARE_PROVIDER_SITE_OTHER): Payer: Self-pay | Admitting: Gastroenterology

## 2019-12-03 NOTE — Telephone Encounter (Signed)
Angela Kent is aware of the results

## 2019-12-03 NOTE — Telephone Encounter (Signed)
Patient called regarding test results - please advise - ph# [phone]

## 2019-12-03 NOTE — Telephone Encounter (Signed)
Angela Kent - can you give her a call back as discussed per result note

## 2019-12-17 ENCOUNTER — Ambulatory Visit (INDEPENDENT_AMBULATORY_CARE_PROVIDER_SITE_OTHER): Payer: Medicaid Other | Admitting: Urology

## 2019-12-17 ENCOUNTER — Other Ambulatory Visit: Payer: Self-pay

## 2019-12-17 ENCOUNTER — Encounter: Payer: Self-pay | Admitting: Urology

## 2019-12-17 VITALS — BP 106/76 | HR 80 | Temp 98.6°F | Ht 65.0 in | Wt 207.8 lb

## 2019-12-17 DIAGNOSIS — N3946 Mixed incontinence: Secondary | ICD-10-CM

## 2019-12-17 DIAGNOSIS — N390 Urinary tract infection, site not specified: Secondary | ICD-10-CM | POA: Diagnosis not present

## 2019-12-17 LAB — MICROSCOPIC EXAMINATION: Renal Epithel, UA: NONE SEEN /hpf

## 2019-12-17 LAB — URINALYSIS, ROUTINE W REFLEX MICROSCOPIC
Bilirubin, UA: NEGATIVE
Glucose, UA: NEGATIVE
Ketones, UA: NEGATIVE
Nitrite, UA: NEGATIVE
Protein,UA: NEGATIVE
Specific Gravity, UA: 1.025 (ref 1.005–1.030)
Urobilinogen, Ur: 1 mg/dL (ref 0.2–1.0)
pH, UA: 6.5 (ref 5.0–7.5)

## 2019-12-17 LAB — BLADDER SCAN AMB NON-IMAGING: Scan Result: 21

## 2019-12-17 NOTE — Progress Notes (Signed)
Bladder Scan Patient can void: 21 ml Performed By: Bridgette Habermann, LPN  Urological Symptom Review  Patient is experiencing the following symptoms: Frequent urination Hard to postpone urination Burning/pain with urination Get up at night to urinate Leakage of urine Stream starts and stops Trouble starting stream Urinary tract infection Injury to kidneys/bladder Painful intercourse Weak stream   Review of Systems  Gastrointestinal (upper)  : Indigestion/heartburn  Gastrointestinal (lower) : Negative for lower GI symptoms  Constitutional : Fatigue  Skin: Negative for skin symptoms  Eyes: Negative for eye symptoms  Ear/Nose/Throat : Sinus problems  Hematologic/Lymphatic: Negative for Hematologic/Lymphatic symptoms  Cardiovascular : Negative for cardiovascular symptoms  Respiratory : Negative for respiratory symptoms  Endocrine: Excessive thirst  Musculoskeletal: Back pain Joint pain  Neurological: Negative for neurological symptoms  Psychologic: Depression Anxiety

## 2019-12-17 NOTE — Progress Notes (Signed)
12/17/2019 9:05 AM   Angela Kent [birth year] [id number]  Referring provider: [place], [place], [place] [address] [place],  [place] [address]  Recurrent UTI  HPI: Angela Kent is a 34yo here for evaluation of recurrent UTI. She has had 3 document UTIs since 04/2019. She has a hx of bladder injury during hysterectomy requiring repair at [facility]. Per operative report injury was at the trigone but not involving the ureteral orifices. Since then she has incontinence with movement and with urge. She does not have continuous incontinence. She uses 4-5 pads per day. Nocturia 3x small amounts. Often she has to strain to urinate.  CT from 12/02/2019 showed no GU abnormalities   PMH: Past Medical History:  Diagnosis Date  . Acid reflux   . Anxiety   . Asthma   . Depression   . Stomach ulcer     Surgical History: Past Surgical History:  Procedure Laterality Date  . APPENDECTOMY    . BIOPSY  09/25/2018   Procedure: BIOPSY;  Surgeon: Malissa Hippo, MD;  [facility];  Service: Endoscopy;;  . CESAREAN SECTION    . CHOLECYSTECTOMY    . ESOPHAGOGASTRODUODENOSCOPY N/A 09/25/2018   Procedure: ESOPHAGOGASTRODUODENOSCOPY (EGD);  Surgeon: Malissa Hippo, MD;  [facility];  Service: Endoscopy;  Laterality: N/A;  1200  . PARTIAL HYSTERECTOMY    . TONSILLECTOMY      Home Medications:  Allergies as of 12/17/2019      Reactions   Codeine Hives      Medication List       Accurate as of December 17, 2019  9:05 AM. If you have any questions, ask your nurse or doctor.        calcium carbonate 750 MG chewable tablet Commonly known as: TUMS EX Chew 2 tablets by mouth 2 (two) times daily as needed for heartburn.   dicyclomine 10 MG capsule Commonly known as: BENTYL Take 1 capsule (10 mg total) by mouth 3 (three) times daily as needed (abd pain).   escitalopram 10 MG tablet Commonly known as: LEXAPRO Take 10 mg by mouth daily.   GOODY HEADACHE PO Take 1 packet by mouth daily  as needed (for headache).   hydrOXYzine 25 MG capsule Commonly known as: VISTARIL Take 25 mg by mouth as needed.   Hyoscyamine Sulfate SL 0.125 MG Subl Commonly known as: Levsin/SL Place 0.125 mg under the tongue 2 (two) times daily as needed (diarrhea).   pantoprazole 40 MG tablet Commonly known as: PROTONIX Take 1 tablet (40 mg total) by mouth 2 (two) times daily. Take 30 min before breakfast and supper   sucralfate 1 g tablet Commonly known as: Carafate Take 1 tablet (1 g total) by mouth 2 (two) times daily before a meal.       Allergies:  Allergies  Allergen Reactions  . Codeine Hives    Family History: History reviewed. No pertinent family history.  Social History:  reports that she has been smoking. She has never used smokeless tobacco. She reports that she does not drink alcohol and does not use drugs.  ROS: All other review of systems were reviewed and are negative except what is noted above in HPI  Physical Exam: BP 106/76   Pulse 80   Temp 98.6 F (37 C)   Ht 5\' 5"  (1.651 m)   Wt 207 lb 12.8 oz (94.3 kg)   BMI 34.58 kg/m   Constitutional:  Alert and oriented, No acute distress. HEENT: [place]  AT, moist mucus membranes.  Trachea midline, no masses. Cardiovascular: No clubbing, cyanosis, or edema. Respiratory: Normal respiratory effort, no increased work of breathing. GI: Abdomen is soft, nontender, nondistended, no abdominal masses GU: No CVA tenderness.  Lymph: No cervical or inguinal lymphadenopathy. Skin: No rashes, bruises or suspicious lesions. Neurologic: Grossly intact, no focal deficits, moving all 4 extremities. Psychiatric: Normal mood and affect.  Laboratory Data: Lab Results  Component Value Date   WBC 8.3 02/19/2019   HGB 15.8 (H) 02/19/2019   HCT 45.9 (H) 02/19/2019   MCV 87.8 02/19/2019   PLT 247 02/19/2019    Lab Results  Component Value Date   CREATININE 0.58 02/19/2019    No results found for: PSA  No results found for:  TESTOSTERONE  No results found for: HGBA1C  Urinalysis    Component Value Date/Time   APPEARANCEUR Clear 12/17/2019 0847   GLUCOSEU Negative 12/17/2019 0847   BILIRUBINUR Negative 12/17/2019 0847   PROTEINUR Negative 12/17/2019 0847   NITRITE Negative 12/17/2019 0847   LEUKOCYTESUR Trace (A) 12/17/2019 0847    Lab Results  Component Value Date   LABMICR See below: 12/17/2019   WBCUA 6-10 (A) 12/17/2019   LABEPIT 0-10 12/17/2019   MUCUS Present 12/17/2019   BACTERIA Few 12/17/2019    Pertinent Imaging: CT 12/02/2019: Images reviewed and discussed with the patient No results found for this or any previous visit.  No results found for this or any previous visit.  No results found for this or any previous visit.  No results found for this or any previous visit.  No results found for this or any previous visit.  No results found for this or any previous visit.  No results found for this or any previous visit.  No results found for this or any previous visit.   Assessment & Plan:    1. Recurrent UTI -We will obtain UDS due to hx of bladder injury requiring repair and mixed incontinence - Urinalysis, Routine w reflex microscopic - BLADDER SCAN AMB NON-IMAGING   No follow-ups on file.  Wilkie Aye, MD  [facility] [place]

## 2019-12-17 NOTE — Patient Instructions (Signed)

## 2019-12-19 LAB — URINE CULTURE

## 2019-12-21 NOTE — Progress Notes (Signed)
Results sent via my chart 

## 2020-01-14 ENCOUNTER — Other Ambulatory Visit: Payer: Self-pay

## 2020-01-14 ENCOUNTER — Ambulatory Visit (INDEPENDENT_AMBULATORY_CARE_PROVIDER_SITE_OTHER): Payer: Medicaid Other | Admitting: Gastroenterology

## 2020-01-14 ENCOUNTER — Encounter (INDEPENDENT_AMBULATORY_CARE_PROVIDER_SITE_OTHER): Payer: Self-pay | Admitting: Gastroenterology

## 2020-01-14 VITALS — BP 101/71 | HR 93 | Temp 97.8°F | Ht 65.0 in | Wt 211.0 lb

## 2020-01-14 DIAGNOSIS — R1011 Right upper quadrant pain: Secondary | ICD-10-CM

## 2020-01-14 DIAGNOSIS — A048 Other specified bacterial intestinal infections: Secondary | ICD-10-CM | POA: Diagnosis not present

## 2020-01-14 NOTE — Patient Instructions (Signed)
Start FDGard (buy over the counter) if you have presence of abdominal pain Once you are off the Omeprazole for 2 weeks, please have the breath test for h. pylori done Smoking cessation Schedule upper GI series Can stop Bentyl and Levsin as they have not provided any symptom relief

## 2020-01-14 NOTE — Progress Notes (Signed)
Katrinka Blazing, M.D. Gastroenterology & Hepatology [facility] [address] [place], [place] [address]  Primary Care Physician: [place], [place], [place] [address] [place] [address]  I will communicate my assessment and recommendations to the referring MD via EMR. "Note: Occasional unusual wording and randomly placed punctuation marks may result from the use of speech recognition technology to transcribe this document"  Problems: 1. Postprandial epigastric pain  History of Present Illness: Angela Kent is a 34 y.o. female with PMH GERD, ashtma, depression, and chronic NSAID intake, who presents for follow up of postprandial epigastric pain.  The patient was last seen on 11/16/2019. At that time, the patient was given Levsin for abdominal pain.  Underwent a CT of the abdomen with IV contrast on 12/02/2019 Which showed presence of a 2.7 x 6.7 cm cystic structure in the hemipelvis, possibly an ovarian cyst.  There was no presence of any other acute alterations in her abdomen.  Patient reports that for the last year and 1/2 to 2 years she has had intermittent bouts of dull abdominal pain located in her upper abdomen that does not radiate, which worsens when she eats (no specific type of food triggers it) as well as nausea without vomiting. States 3 weeks ago she noticed some bright red blood when she wipes - this only happened once and was associated to some perianal burning that was self-limited. Denies any melena. Patient reports having abdominal pain when sleeping that wakes her from her sleep.  Due to this, patient has undergone previous investigations including an EGD on 09/25/2018 that showed normal esophagus, presence of edema, erythema and erosions in the antrum (biopsies were taken and were negative for H. pylori), normal duodenum.  Notably, the patient had H. pylori in the past which was treated but eradication was never confirmed with the  patient never performed the breath test that was ordered in January 2021.  Patient states that she has been taking sucralfate, Bentyl, and Levsin in the past but she does not feel it has helped for her symptoms.  States he takes omeprazole 40 mg qday for heartburn, which control her symptoms completely.  Patient takes ibuprofen every couple of days for headaches. She does not take any Goody powders.  Patient states that she has watery to soft BMs per day, close to 2-3 Bms per day. This has happened after her cholecystectomy. She has not tried taking Imodium.  The patient denies having any nausea, vomiting, fever, chills, hematochezia, melena, hematemesis, jaundice, pruritus or weight loss.  FHx: mother has ?UC, neg for any other gastrointestinal/liver disease, no malignancies Social: smokes half a pack a day, neg alcohol or illicit drug use Surgical: appendectomy, cholecystectomy, c-section, partial hysterectomy, bladder repair surgery  Past Medical History: Past Medical History:  Diagnosis Date  . Acid reflux   . Anxiety   . Asthma   . Depression   . Stomach ulcer     Past Surgical History: Past Surgical History:  Procedure Laterality Date  . APPENDECTOMY    . BIOPSY  09/25/2018   Procedure: BIOPSY;  Surgeon: Malissa Hippo, MD;  [facility];  Service: Endoscopy;;  . CESAREAN SECTION    . CHOLECYSTECTOMY    . ESOPHAGOGASTRODUODENOSCOPY N/A 09/25/2018   Procedure: ESOPHAGOGASTRODUODENOSCOPY (EGD);  Surgeon: Malissa Hippo, MD;  [facility];  Service: Endoscopy;  Laterality: N/A;  1200  . PARTIAL HYSTERECTOMY    . TONSILLECTOMY  Family History:History reviewed. No pertinent family history.  Social History: Social History   Tobacco Use  Smoking Status Current Every Day Smoker  . Packs/day: 0.50  Smokeless Tobacco Never Used   Social History   Substance and Sexual Activity  Alcohol Use Never   Social History   Substance and Sexual  Activity  Drug Use Never    Allergies: Allergies  Allergen Reactions  . Codeine Hives    Medications: Current Outpatient Medications  Medication Sig Dispense Refill  . calcium carbonate (TUMS EX) 750 MG chewable tablet Chew 2 tablets by mouth 2 (two) times daily as needed for heartburn.     . dicyclomine (BENTYL) 10 MG capsule Take 1 capsule (10 mg total) by mouth 3 (three) times daily as needed (abd pain). 60 capsule 1  . escitalopram (LEXAPRO) 10 MG tablet Take 10 mg by mouth daily.     . hydrOXYzine (VISTARIL) 25 MG capsule Take 25 mg by mouth as needed.     Marland Kitchen omeprazole (PRILOSEC) 40 MG capsule Take 40 mg by mouth daily.    . sucralfate (CARAFATE) 1 g tablet Take 1 tablet (1 g total) by mouth 2 (two) times daily before a meal. 60 tablet 6  . Hyoscyamine Sulfate SL (LEVSIN/SL) 0.125 MG SUBL Place 0.125 mg under the tongue 2 (two) times daily as needed (diarrhea). (Patient not taking: Reported on 01/14/2020) 60 tablet 3   No current facility-administered medications for this visit.    Review of Systems: GENERAL: negative for malaise, night sweats HEENT: No changes in hearing or vision, no nose bleeds or other nasal problems. NECK: Negative for lumps, goiter, pain and significant neck swelling RESPIRATORY: Negative for cough, wheezing CARDIOVASCULAR: Negative for chest pain, leg swelling, palpitations, orthopnea GI: SEE HPI MUSCULOSKELETAL: Negative for joint pain or swelling, back pain, and muscle pain. SKIN: Negative for lesions, rash PSYCH: Negative for sleep disturbance, mood disorder and recent psychosocial stressors. HEMATOLOGY Negative for prolonged bleeding, bruising easily, and swollen nodes. ENDOCRINE: Negative for cold or heat intolerance, polyuria, polydipsia and goiter. NEURO: negative for tremor, gait imbalance, syncope and seizures. The remainder of the review of systems is noncontributory.   Physical Exam: BP 101/71 (BP Location: Left Arm, Patient Position:  Sitting, Cuff Size: Large)   Pulse 93   Temp 97.8 F (36.6 C) (Oral)   Ht 5\' 5"  (1.651 m)   Wt 211 lb (95.7 kg)   BMI 35.11 kg/m  GENERAL: The patient is AO x3, in no acute distress. HEENT: Head is normocephalic and atraumatic. EOMI are intact. Mouth is well hydrated and without lesions. NECK: Supple. No masses LUNGS: Clear to auscultation. No presence of rhonchi/wheezing/rales. Adequate chest expansion HEART: RRR, normal s1 and s2. ABDOMEN: Soft, nontender, no guarding, no peritoneal signs, and nondistended. BS +. No masses. EXTREMITIES: Without any cyanosis, clubbing, rash, lesions or edema. NEUROLOGIC: AOx3, no focal motor deficit. SKIN: no jaundice, no rashes  Imaging/Labs: as above  I personally reviewed and interpreted the available labs, imaging and endoscopic files.  Impression and Plan: Angela Kent is a 34 y.o. female with PMH GERD, ashtma, depression, and chronic NSAID intake, who presents for follow up of postprandial epigastric pain.  The patient has presented chronic symptoms of abdominal pain of unclear etiology which have been investigated with esophagogastroduodenospy in the past and recently with a CT of the abdomen, none of which have shown a clear reason for her symptomatology but there was presence of significant gastritis previously, which was likely related  to NSAIDs.  I advised the patient to avoid taking NSAIDs and she can take some Tylenol instead.  I also advised her to quit smoking as this could be worsening her part of her symptoms.  For now, we will evaluate further her symptomatology with an upper GI series and will also check for H. pylori breath test off PPI for 2 weeks.  She may benefit from implementing FDgard to decrease dyspepsia.  If the testing is negative and the patient is still symptomatic, will consider repeating EGD. If EGD is normal, will attempt TCA trial for possible FD.  -Start FDGard (buy over the counter) if you have presence of abdominal  pain - Breath test for h. Pylori off PPI - Smoking cessation - Schedule upper GI series - Can stop Bentyl and Levsin as they have not provided any symptom relief - RTC 2 months  All questions were answered.      Dolores Frame, MD Gastroenterology and Hepatology [facility]

## 2020-01-15 ENCOUNTER — Ambulatory Visit: Payer: Medicaid Other | Admitting: Urology

## 2020-01-27 ENCOUNTER — Telehealth: Payer: Self-pay | Admitting: Urology

## 2020-01-27 ENCOUNTER — Other Ambulatory Visit: Payer: Self-pay

## 2020-01-27 NOTE — Telephone Encounter (Signed)
This patient called and was very upset. She stated she drove to [facility] and they told her they dont take her insurance. She states she is in a lot of pain and is still having issues. She asked for a nurse to let her know what she needs to do.

## 2020-01-27 NOTE — Telephone Encounter (Signed)
Please advise 

## 2020-01-28 ENCOUNTER — Telehealth: Payer: Self-pay

## 2020-01-28 ENCOUNTER — Other Ambulatory Visit: Payer: Self-pay

## 2020-01-28 ENCOUNTER — Ambulatory Visit (HOSPITAL_COMMUNITY)
Admission: RE | Admit: 2020-01-28 | Discharge: 2020-01-28 | Disposition: A | Discharge status: Home / Self Care | Payer: Medicaid Other | Source: Ambulatory Visit | Attending: Gastroenterology | Admitting: Gastroenterology

## 2020-01-28 DIAGNOSIS — R1011 Right upper quadrant pain: Secondary | ICD-10-CM

## 2020-01-28 NOTE — Telephone Encounter (Signed)
Pt called again asking what to do now that [facility] would not take her insurance for UDS. I explained I had spoke with Dr. Ronne Binning and he asked pt to call [facility] dept and see id they would take her insurance and we could refer her there if so. She is to call back to let us know.

## 2020-02-02 ENCOUNTER — Ambulatory Visit (HOSPITAL_COMMUNITY): Payer: Medicaid Other

## 2020-02-04 ENCOUNTER — Ambulatory Visit (HOSPITAL_COMMUNITY)
Admission: RE | Admit: 2020-02-04 | Discharge: 2020-02-04 | Disposition: A | Discharge status: Home / Self Care | Payer: Medicaid Other | Source: Ambulatory Visit | Attending: Gastroenterology | Admitting: Gastroenterology

## 2020-02-04 ENCOUNTER — Other Ambulatory Visit: Payer: Self-pay

## 2020-02-04 DIAGNOSIS — R1011 Right upper quadrant pain: Secondary | ICD-10-CM | POA: Diagnosis not present

## 2020-02-18 ENCOUNTER — Other Ambulatory Visit (INDEPENDENT_AMBULATORY_CARE_PROVIDER_SITE_OTHER): Payer: Self-pay

## 2020-02-18 DIAGNOSIS — R1319 Other dysphagia: Secondary | ICD-10-CM

## 2020-02-22 ENCOUNTER — Other Ambulatory Visit (HOSPITAL_COMMUNITY)
Admission: RE | Admit: 2020-02-22 | Discharge: 2020-02-22 | Disposition: A | Discharge status: Home / Self Care | Payer: Medicaid Other | Source: Ambulatory Visit | Attending: Gastroenterology | Admitting: Gastroenterology

## 2020-02-22 ENCOUNTER — Other Ambulatory Visit: Payer: Self-pay

## 2020-02-22 ENCOUNTER — Encounter (HOSPITAL_COMMUNITY): Payer: Self-pay | Admitting: Gastroenterology

## 2020-02-22 DIAGNOSIS — Z20822 Contact with and (suspected) exposure to covid-19: Secondary | ICD-10-CM | POA: Diagnosis not present

## 2020-02-22 DIAGNOSIS — Z01812 Encounter for preprocedural laboratory examination: Secondary | ICD-10-CM | POA: Diagnosis not present

## 2020-02-22 LAB — SARS CORONAVIRUS 2 (TAT 6-24 HRS): SARS Coronavirus 2: NEGATIVE

## 2020-02-23 ENCOUNTER — Ambulatory Visit (HOSPITAL_COMMUNITY)
Admission: RE | Admit: 2020-02-23 | Discharge: 2020-02-23 | Disposition: A | Discharge status: Home / Self Care | Payer: Medicaid Other | Attending: Gastroenterology | Admitting: Gastroenterology

## 2020-02-23 ENCOUNTER — Encounter (HOSPITAL_COMMUNITY): Payer: Self-pay | Admitting: Gastroenterology

## 2020-02-23 ENCOUNTER — Encounter (HOSPITAL_COMMUNITY)
Admission: RE | Disposition: A | Discharge status: Home / Self Care | Payer: Self-pay | Source: Home / Self Care | Attending: Gastroenterology

## 2020-02-23 ENCOUNTER — Ambulatory Visit (HOSPITAL_COMMUNITY): Payer: Medicaid Other | Admitting: Anesthesiology

## 2020-02-23 ENCOUNTER — Other Ambulatory Visit: Payer: Self-pay

## 2020-02-23 DIAGNOSIS — K219 Gastro-esophageal reflux disease without esophagitis: Secondary | ICD-10-CM | POA: Insufficient documentation

## 2020-02-23 DIAGNOSIS — K2950 Unspecified chronic gastritis without bleeding: Secondary | ICD-10-CM | POA: Diagnosis not present

## 2020-02-23 DIAGNOSIS — Z791 Long term (current) use of non-steroidal anti-inflammatories (NSAID): Secondary | ICD-10-CM | POA: Insufficient documentation

## 2020-02-23 DIAGNOSIS — F32A Depression, unspecified: Secondary | ICD-10-CM | POA: Insufficient documentation

## 2020-02-23 DIAGNOSIS — F17210 Nicotine dependence, cigarettes, uncomplicated: Secondary | ICD-10-CM | POA: Insufficient documentation

## 2020-02-23 DIAGNOSIS — R1310 Dysphagia, unspecified: Secondary | ICD-10-CM | POA: Insufficient documentation

## 2020-02-23 DIAGNOSIS — Z79899 Other long term (current) drug therapy: Secondary | ICD-10-CM | POA: Diagnosis not present

## 2020-02-23 DIAGNOSIS — R1013 Epigastric pain: Secondary | ICD-10-CM | POA: Diagnosis not present

## 2020-02-23 DIAGNOSIS — R1319 Other dysphagia: Secondary | ICD-10-CM

## 2020-02-23 DIAGNOSIS — K259 Gastric ulcer, unspecified as acute or chronic, without hemorrhage or perforation: Secondary | ICD-10-CM | POA: Diagnosis not present

## 2020-02-23 HISTORY — PX: ESOPHAGEAL DILATION: SHX303

## 2020-02-23 HISTORY — PX: BIOPSY: SHX5522

## 2020-02-23 HISTORY — PX: ESOPHAGOGASTRODUODENOSCOPY (EGD) WITH PROPOFOL: SHX5813

## 2020-02-23 SURGERY — ESOPHAGOGASTRODUODENOSCOPY (EGD) WITH PROPOFOL
Anesthesia: General

## 2020-02-23 MED ORDER — GLYCOPYRROLATE 0.2 MG/ML IJ SOLN
0.2000 mg | Freq: Once | INTRAMUSCULAR | Status: AC
  Administered 2020-02-23: 0.2 mg via INTRAVENOUS

## 2020-02-23 MED ORDER — CHLORHEXIDINE GLUCONATE CLOTH 2 % EX PADS: 6.0000 | MEDICATED_PAD | Freq: Once | CUTANEOUS | Status: DC

## 2020-02-23 MED ORDER — GLYCOPYRROLATE 0.2 MG/ML IJ SOLN
INTRAMUSCULAR | Status: AC
  Filled 2020-02-23: qty 1

## 2020-02-23 MED ORDER — OMEPRAZOLE 40 MG PO CPDR: 40.0000 mg | DELAYED_RELEASE_CAPSULE | Freq: Two times a day (BID) | ORAL | 0 refills | Status: DC

## 2020-02-23 MED ORDER — LIDOCAINE HCL (CARDIAC) PF 100 MG/5ML IV SOSY
PREFILLED_SYRINGE | INTRAVENOUS | Status: DC | PRN
  Administered 2020-02-23: 100 mg via INTRAVENOUS

## 2020-02-23 MED ORDER — LIDOCAINE VISCOUS HCL 2 % MT SOLN
15.0000 mL | Freq: Once | OROMUCOSAL | Status: AC
  Administered 2020-02-23: 15 mL via OROMUCOSAL

## 2020-02-23 MED ORDER — PROPOFOL 10 MG/ML IV BOLUS
INTRAVENOUS | Status: DC | PRN
Start: 2020-02-23 — End: 2020-02-23
  Administered 2020-02-23: 100 mg via INTRAVENOUS
  Administered 2020-02-23: 50 mg via INTRAVENOUS
  Administered 2020-02-23: 20 mg via INTRAVENOUS

## 2020-02-23 MED ORDER — STERILE WATER FOR IRRIGATION IR SOLN
Status: DC | PRN
  Administered 2020-02-23: 100 mL

## 2020-02-23 MED ORDER — LIDOCAINE VISCOUS HCL 2 % MT SOLN
OROMUCOSAL | Status: AC
  Filled 2020-02-23: qty 15

## 2020-02-23 MED ORDER — LACTATED RINGERS IV SOLN: INTRAVENOUS | Status: DC | PRN

## 2020-02-23 MED ORDER — PROPOFOL 500 MG/50ML IV EMUL
INTRAVENOUS | Status: DC | PRN
  Administered 2020-02-23: 150 ug/kg/min via INTRAVENOUS

## 2020-02-23 MED ORDER — LACTATED RINGERS IV SOLN: Freq: Once | INTRAVENOUS | Status: AC

## 2020-02-23 NOTE — Transfer of Care (Signed)
Immediate Anesthesia Transfer of Care Note  Patient: Angela Kent  Procedure(s) Performed: ESOPHAGOGASTRODUODENOSCOPY (EGD) WITH PROPOFOL (N/A ) ESOPHAGEAL DILATION (N/A ) BIOPSY  Patient Location: PACU  Anesthesia Type:General  Level of Consciousness: awake, alert  and oriented  Airway & Oxygen Therapy: Patient Spontanous Breathing  Post-op Assessment: Report given to RN and Post -op Vital signs reviewed and stable  Post vital signs: Reviewed and stable  Last Vitals:  Vitals Value Taken Time  BP    Temp    Pulse    Resp    SpO2      Last Pain:  Vitals:   02/23/20 0936  TempSrc:   PainSc: 6          Complications: No complications documented.

## 2020-02-23 NOTE — Anesthesia Preprocedure Evaluation (Signed)
Anesthesia Evaluation  Patient identified by MRN, date of birth, ID band Patient awake    Reviewed: Allergy & Precautions, NPO status , Patient's Chart, lab work & pertinent test results  Airway Mallampati: II  TM Distance: >3 FB Neck ROM: Full    Dental  (+) Teeth Intact, Dental Advisory Given [place] :   Pulmonary asthma , Current SmokerPatient did not abstain from smoking.,    Pulmonary exam normal breath sounds clear to auscultation       Cardiovascular Exercise Tolerance: Good Normal cardiovascular exam Rhythm:Regular Rate:Normal     Neuro/Psych PSYCHIATRIC DISORDERS Anxiety Depression negative neurological ROS     GI/Hepatic Neg liver ROS, PUD, GERD  Medicated and Controlled,  Endo/Other  negative endocrine ROS  Renal/GU negative Renal ROS     Musculoskeletal negative musculoskeletal ROS (+)   Abdominal   Peds  Hematology negative hematology ROS (+)   Anesthesia Other Findings   Reproductive/Obstetrics negative OB ROS                             Anesthesia Physical Anesthesia Plan  ASA: II  Anesthesia Plan: General   Post-op Pain Management:    Induction: Intravenous  PONV Risk Score and Plan: TIVA  Airway Management Planned: Nasal Cannula and Natural Airway  Additional Equipment:   Intra-op Plan:   Post-operative Plan:   Informed Consent: I have reviewed the patients History and Physical, chart, labs and discussed the procedure including the risks, benefits and alternatives for the proposed anesthesia with the patient or authorized representative who has indicated his/her understanding and acceptance.     Dental advisory given  Plan Discussed with: CRNA and Surgeon  Anesthesia Plan Comments:         Anesthesia Quick Evaluation

## 2020-02-23 NOTE — H&P (Signed)
Angela Kent is an 35 y.o. female.   Chief Complaint: Chest and epigastric pain HPI: Angela Kent is a 35 y.o. female with PMH GERD, ashtma, depression, and chronic NSAID intake, who comes to the hospital for evaluation of low retrosternal and epigastric pain.  The patient has presented chronic symptoms of dull epigastric abdominal pain and lower retrosternal pain for the last 2 years which does not radiate.  It gets worse when she swallows any food and it causes significant discomfort.  She has undergone an EGD in the past on 09/25/2018 which showed a normal esophagus, there was presence of erythema and erosions in the antrum (biopsies were taken and were negative for H. pylori), normal duodenum.   She had H. pylori in the past that was treated but confirmation of eradication has never been performed.  Has been taking omeprazole 40 mg every day.  Occasionally takes ibuprofen for headaches.  Notably, the patient had an upper GI series on 02/04/2020 which showed a narrowing at the GE junction that was affecting the passage of the barium tablet.  Past Medical History:  Diagnosis Date  . Acid reflux   . Anxiety   . Asthma   . Depression   . Stomach ulcer     Past Surgical History:  Procedure Laterality Date  . APPENDECTOMY    . BIOPSY  09/25/2018   Procedure: BIOPSY;  Surgeon: Malissa Hippo, MD;  [facility];  Service: Endoscopy;;  . CESAREAN SECTION    . CHOLECYSTECTOMY    . ESOPHAGOGASTRODUODENOSCOPY N/A 09/25/2018   Procedure: ESOPHAGOGASTRODUODENOSCOPY (EGD);  Surgeon: Malissa Hippo, MD;  [facility];  Service: Endoscopy;  Laterality: N/A;  1200  . PARTIAL HYSTERECTOMY    . TONSILLECTOMY      History reviewed. No pertinent family history. Social History:  reports that she has been smoking. She has been smoking about 0.50 packs per day. She has never used smokeless tobacco. She reports that she does not drink alcohol and does not use drugs.  Allergies:   Allergies  Allergen Reactions  . Codeine Hives    Medications Prior to Admission  Medication Sig Dispense Refill  . dicyclomine (BENTYL) 10 MG capsule Take 1 capsule (10 mg total) by mouth 3 (three) times daily as needed (abd pain). (Patient taking differently: Take 10 mg by mouth 3 (three) times daily as needed (abdominal pain).) 60 capsule 1  . escitalopram (LEXAPRO) 10 MG tablet Take 10 mg by mouth daily.     Marland Kitchen omeprazole (PRILOSEC) 40 MG capsule Take 40 mg by mouth daily.      Results for orders placed or performed during the hospital encounter of 02/22/20 (from the past 48 hour(s))  SARS CORONAVIRUS 2 (TAT 6-24 HRS) Nasopharyngeal Nasopharyngeal Swab     Status: None   Collection Time: 02/22/20  8:30 AM   Specimen: Nasopharyngeal Swab  Result Value Ref Range   SARS Coronavirus 2 NEGATIVE NEGATIVE    Comment: (NOTE) SARS-CoV-2 target nucleic acids are NOT DETECTED.  The SARS-CoV-2 RNA is generally detectable in upper and lower respiratory specimens during the acute phase of infection. Negative results do not preclude SARS-CoV-2 infection, do not rule out co-infections with other pathogens, and should not be used as the sole basis for treatment or other patient management decisions. Negative results must be combined with clinical observations, patient history, and epidemiological information. The expected result is Negative.  Fact Sheet for Patients: HairSlick.no  Fact Sheet for Healthcare Providers:  quierodirigir.com  This test is not yet approved or cleared by the [place] and  has been authorized for detection and/or diagnosis of SARS-CoV-2 by FDA under an Emergency Use Authorization (EUA). This EUA will remain  in effect (meaning this test can be used) for the duration of the COVID-19 declaration under Se ction 564(b)(1) of the Act, 21 U.S.C. section 360bbb-3(b)(1), unless the authorization is terminated  or revoked sooner.  Performed at [facility], [address] [place], [place] [address]    No results found.  Review of Systems  Constitutional: Negative.   HENT: Negative.   Eyes: Negative.   Respiratory: Negative.   Cardiovascular: Negative.   Gastrointestinal: Positive for abdominal pain.  Endocrine: Negative.   Genitourinary: Negative.   Musculoskeletal: Negative.   Skin: Negative.   Allergic/Immunologic: Negative.   Neurological: Negative.   Hematological: Negative.   Psychiatric/Behavioral: Negative.     Blood pressure 99/77, temperature 98.6 F (37 C), temperature source Oral, resp. rate 20, height 5\' 5"  (1.651 m), weight 99.8 kg, SpO2 98 %. Physical Exam  GENERAL: The patient is AO x3, in no acute distress. HEENT: Head is normocephalic and atraumatic. EOMI are intact. Mouth is well hydrated and without lesions. NECK: Supple. No masses LUNGS: Clear to auscultation. No presence of rhonchi/wheezing/rales. Adequate chest expansion HEART: RRR, normal s1 and s2. ABDOMEN: Soft, nontender, no guarding, no peritoneal signs, and nondistended. BS +. No masses. EXTREMITIES: Without any cyanosis, clubbing, rash, lesions or edema. NEUROLOGIC: AOx3, no focal motor deficit. SKIN: no jaundice, no rashes  Assessment/Plan Angela Kent is a 35 y.o. female with PMH GERD, ashtma, depression, and chronic NSAID intake, who comes to the hospital for evaluation of low retrosternal and epigastric pain.  We will proceed with EGD with esophageal dilation given radiographic findings.  34, MD 02/23/2020, 9:24 AM

## 2020-02-23 NOTE — Anesthesia Postprocedure Evaluation (Signed)
Anesthesia Post Note  Patient: Angela Kent  Procedure(s) Performed: ESOPHAGOGASTRODUODENOSCOPY (EGD) WITH PROPOFOL (N/A ) ESOPHAGEAL DILATION (N/A ) BIOPSY  Patient location during evaluation: Phase II Anesthesia Type: General Level of consciousness: awake and oriented Pain management: satisfactory to patient Vital Signs Assessment: post-procedure vital signs reviewed and stable Respiratory status: spontaneous breathing and respiratory function stable Cardiovascular status: stable Postop Assessment: no apparent nausea or vomiting Anesthetic complications: no   No complications documented.   Last Vitals:  Vitals:   02/23/20 0811 02/23/20 1002  BP: 99/77   Pulse:  (!) 105  Resp: 20 (!) 22  Temp: 37 C 36.5 C  SpO2: 98% 94%    Last Pain:  Vitals:   02/23/20 1002  TempSrc: Oral  PainSc:                  Lorin Glass

## 2020-02-23 NOTE — Op Note (Addendum)
[facility] Patient Name: Angela Kent Procedure Date: 02/23/2020 9:21 AM MRN: [id number] Date of Birth: [birth year] Attending MD: Katrinka Blazing ,  CSN: [id number] Age: 35 Admit Type: Outpatient Procedure:                Upper GI endoscopy Indications:              Epigastric abdominal pain Providers:                Katrinka Blazing, Sheran Fava, Burke Keels, Technician Referring MD:              Medicines:                Monitored Anesthesia Care Complications:            No immediate complications. Estimated Blood Loss:     Estimated blood loss: none. Procedure:                Pre-Anesthesia Assessment:                           - Prior to the procedure, a History and Physical                            was performed, and patient medications, allergies                            and sensitivities were reviewed. The patient's                            tolerance of previous anesthesia was reviewed.                           - The risks and benefits of the procedure and the                            sedation options and risks were discussed with the                            patient. All questions were answered and informed                            consent was obtained.                           - ASA Grade Assessment: II - A patient with mild                            systemic disease.                           After obtaining informed consent, the endoscope was                            passed under direct vision. Throughout the  procedure, the patient's blood pressure, pulse, and                            oxygen saturations were monitored continuously. The                            [id number]-[id number] ([id number]) scope was introduced through the                            mouth, and advanced to the second part of duodenum.                            The upper GI endoscopy was accomplished without                             difficulty. The patient tolerated the procedure                            well. Scope In: 9:39:12 AM Scope Out: 9:56:37 AM Total Procedure Duration: 0 hours 17 minutes 25 seconds  Findings:      No endoscopic abnormality was evident in the esophagus to explain the       patient's complaint of dysphagia. It was decided, however, to proceed       with dilation at the gastroesophageal junction. A TTS dilator was passed       through the scope. Dilation with an 18-19-20 mm balloon dilator was       performed to 20 mm. Hematin was observed upon reinspection.      Two non-bleeding cratered gastric ulcers with a clean ulcer base       (Forrest Class III) were found in the gastric antrum. The largest lesion       was 10 mm in largest dimension. Biopsies were taken from the edges of       the ulcer, body and antrum with a cold forceps for Helicobacter pylori       testing.      The examined duodenum was normal. Impression:               - No endoscopic esophageal abnormality to explain                            patient's dysphagia. Esophagus dilated. Dilated.                           - Non-bleeding gastric ulcers with a clean ulcer                            base (Forrest Class III). Biopsied.                           - Normal examined duodenum. Moderate Sedation:      Per Anesthesia Care Recommendation:           - Discharge patient to home (ambulatory).                           - Resume previous diet.                           -  Await pathology results.                           - Use Prilosec (omeprazole) 40 mg PO BID for 3                            months.                           - Please proceed with breath test after you have                            taken the medication for three months.                           - Repeat upper endoscopy in 3 months for                            surveillance.                           - Avoid NSAIDs. Procedure Code(s):        ---  Professional ---                           [phone], Esophagogastroduodenoscopy, flexible,                            transoral; with transendoscopic balloon dilation of                            esophagus (less than 30 mm diameter)                           [id number], 59, Esophagogastroduodenoscopy, flexible,                            transoral; with biopsy, single or multiple Diagnosis Code(s):        --- Professional ---                           R13.10, Dysphagia, unspecified                           K25.9, Gastric ulcer, unspecified as acute or                            chronic, without hemorrhage or perforation                           R10.13, Epigastric pain CPT copyright 2019 American Medical Association. All rights reserved. The codes documented in this report are preliminary and upon coder review may  be revised to meet current compliance requirements. Katrinka Blazing, MD Katrinka Blazing,  02/23/2020 10:07:04 AM This report has been signed electronically. Number of Addenda: 0

## 2020-02-23 NOTE — Discharge Instructions (Signed)
Upper Endoscopy, Adult, Care After This sheet gives you information about how to care for yourself after your procedure. Your health care provider may also give you more specific instructions. If you have problems or questions, contact your health care provider. What can I expect after the procedure? After the procedure, it is common to have:  A sore throat.  Mild stomach pain or discomfort.  Bloating.  Nausea. Follow these instructions at home:  Follow instructions from your health care provider about what to eat or drink after your procedure.  Return to your normal activities as told by your health care provider. Ask your health care provider what activities are safe for you.  Take over-the-counter and prescription medicines only as told by your health care provider.  If you were given a sedative during the procedure, it can affect you for several hours. Do not drive or operate machinery until your health care provider says that it is safe.  Keep all follow-up visits as told by your health care provider. This is important.   Contact a health care provider if you have:  A sore throat that lasts longer than one day.  Trouble swallowing. Get help right away if:  You vomit blood or your vomit looks like coffee grounds.  You have: ? A fever. ? Bloody, black, or tarry stools. ? A severe sore throat or you cannot swallow. ? Difficulty breathing. ? Severe pain in your chest or abdomen. Summary  After the procedure, it is common to have a sore throat, mild stomach discomfort, bloating, and nausea.  If you were given a sedative during the procedure, it can affect you for several hours. Do not drive or operate machinery until your health care provider says that it is safe.  Follow instructions from your health care provider about what to eat or drink after your procedure.  Return to your normal activities as told by your health care provider. This information is not intended to  replace advice given to you by your health care provider. Make sure you discuss any questions you have with your health care provider. Document Revised: 01/27/2019 Document Reviewed: 07/01/2017 Elsevier Patient Education  2021 [employer] Inc. Peptic Ulcer  A peptic ulcer is a sore in the lining of the stomach (gastric ulcer) or the first part of the small intestine (duodenal ulcer). The ulcer causes a gradual wearing away (erosion) of the deeper tissue. What are the causes? Normally, the lining of the stomach and the small intestine protects them from the acid that digests food. The protective lining can be damaged by:  An infection caused by a type of bacteria called Helicobacter pylori or H. pylori.  Regular use of NSAIDs, such as ibuprofen or aspirin.  Rare tumors in the stomach, small intestine, or pancreas (Zollinger-Ellison syndrome). What increases the risk? The following factors may make you more likely to develop this condition:  Smoking.  Having a family history of ulcer disease.  Drinking alcohol.  Having been hospitalized in an intensive care unit (ICU). What are the signs or symptoms? Symptoms of this condition include:  Persistent burning pain in the area between the chest and the belly button. The pain may be worse on an empty stomach and at night.  Heartburn.  Nausea and vomiting.  Bloating. If the ulcer results in bleeding, it can cause:  Black, tarry stools.  Vomiting of bright red blood.  Vomiting of material that looks like coffee grounds. How is this diagnosed? This condition may be diagnosed  based on:  Your medical history and a physical exam.  Various tests or procedures, such as: ? Blood tests, stool tests, or breath tests to check for the H. pylori bacteria. ? An X-ray exam (upper gastrointestinal series) of the esophagus, stomach, and small intestine. ? Upper endoscopy. The health care provider examines the esophagus, stomach, and small  intestine using a small flexible tube that has a video camera at the end. ? Biopsy. A tissue sample is removed to be examined under a microscope. How is this treated? Treatment for this condition may include:  Eliminating the cause of the ulcer, such as smoking or use of NSAIDs, and limiting alcohol and caffeine intake.  Medicines to reduce the amount of acid in your digestive tract.  Antibiotic medicines, if the ulcer is caused by an H. pylori infection.  An upper endoscopy may be used to treat a bleeding ulcer.  Surgery. This may be needed if the bleeding is severe or if the ulcer created a hole somewhere in the digestive system. Follow these instructions at home:  Do not drink alcohol if your health care provider tells you not to drink.  Do not use any products that contain nicotine or tobacco, such as cigarettes, e-cigarettes, and chewing tobacco. If you need help quitting, ask your health care provider.  Take over-the-counter and prescription medicines only as told by your health care provider. ? Do not use over-the-counter medicines in place of prescription medicines unless your health care provider approves. ? Do not take aspirin, ibuprofen, or other NSAIDs unless your health care provider told you to do so.  Take over-the-counter and prescription medicines only as told by your health care provider.  Keep all follow-up visits as told by your health care provider. This is important. Contact a health care provider if:  Your symptoms do not improve within 7 days of starting treatment.  You have ongoing indigestion or heartburn. Get help right away if:  You have sudden, sharp, or persistent pain in your abdomen.  You have bloody or dark black, tarry stools.  You vomit blood or material that looks like coffee grounds.  You become light-headed or you feel faint.  You become weak.  You become sweaty or clammy. Summary  A peptic ulcer is a sore in the lining of the  stomach (gastric ulcer) or the first part of the small intestine (duodenal ulcer). The ulcer causes a gradual wearing away (erosion) of the deeper tissue.  Do not use any products that contain nicotine or tobacco, such as cigarettes, e-cigarettes, and chewing tobacco. If you need help quitting, ask your health care provider.  Take over-the-counter and prescription medicines only as told by your health care provider. Do not use over-the-counter medicines in place of prescription medicines unless your health care provider approves.  Contact your health care provider if you have ongoing indigestion or heartburn.  Keep all follow-up visits as told by your health care provider. This is important. This information is not intended to replace advice given to you by your health care provider. Make sure you discuss any questions you have with your health care provider. Document Revised: 08/06/2017 Document Reviewed: 08/06/2017 Elsevier Patient Education  2021 Elsevier Inc. You are being discharged to home.  Resume your previous diet.  We are waiting for your pathology results.  Take Prilosec (omeprazole) 40 mg by mouth twice a day for three months.  Please proceed with breath test after you have taken the medication for three months. Your physician  has recommended a repeat upper endoscopy in three months for surveillance.  Smoking cessation.

## 2020-02-24 ENCOUNTER — Other Ambulatory Visit: Payer: Self-pay

## 2020-02-24 LAB — SURGICAL PATHOLOGY

## 2020-02-25 ENCOUNTER — Encounter (HOSPITAL_COMMUNITY): Payer: Self-pay | Admitting: Gastroenterology

## 2020-02-26 ENCOUNTER — Ambulatory Visit: Payer: Medicaid Other | Admitting: Urology

## 2020-03-31 ENCOUNTER — Ambulatory Visit (INDEPENDENT_AMBULATORY_CARE_PROVIDER_SITE_OTHER): Payer: Medicaid Other | Admitting: Gastroenterology

## 2020-03-31 ENCOUNTER — Telehealth (INDEPENDENT_AMBULATORY_CARE_PROVIDER_SITE_OTHER): Payer: Self-pay

## 2020-03-31 ENCOUNTER — Encounter (INDEPENDENT_AMBULATORY_CARE_PROVIDER_SITE_OTHER): Payer: Self-pay | Admitting: Gastroenterology

## 2020-03-31 NOTE — Telephone Encounter (Signed)
Patient no showed for her appointment with Dr. Levon Hedger on 03/31/2020.

## 2020-03-31 NOTE — Telephone Encounter (Signed)
Noted  

## 2020-04-20 ENCOUNTER — Other Ambulatory Visit (INDEPENDENT_AMBULATORY_CARE_PROVIDER_SITE_OTHER): Payer: Self-pay

## 2020-05-03 ENCOUNTER — Other Ambulatory Visit (INDEPENDENT_AMBULATORY_CARE_PROVIDER_SITE_OTHER): Payer: Self-pay

## 2020-05-03 ENCOUNTER — Encounter (INDEPENDENT_AMBULATORY_CARE_PROVIDER_SITE_OTHER): Payer: Self-pay

## 2020-05-03 DIAGNOSIS — K259 Gastric ulcer, unspecified as acute or chronic, without hemorrhage or perforation: Secondary | ICD-10-CM

## 2020-05-04 ENCOUNTER — Other Ambulatory Visit (INDEPENDENT_AMBULATORY_CARE_PROVIDER_SITE_OTHER): Payer: Self-pay

## 2020-05-23 ENCOUNTER — Other Ambulatory Visit: Payer: Self-pay

## 2020-05-23 ENCOUNTER — Other Ambulatory Visit (HOSPITAL_COMMUNITY)
Admission: RE | Admit: 2020-05-23 | Discharge: 2020-05-23 | Disposition: A | Discharge status: Home / Self Care | Payer: Medicaid Other | Source: Ambulatory Visit | Attending: Gastroenterology | Admitting: Gastroenterology

## 2020-05-23 DIAGNOSIS — Z20822 Contact with and (suspected) exposure to covid-19: Secondary | ICD-10-CM | POA: Diagnosis not present

## 2020-05-23 DIAGNOSIS — Z01812 Encounter for preprocedural laboratory examination: Secondary | ICD-10-CM | POA: Insufficient documentation

## 2020-05-23 LAB — SARS CORONAVIRUS 2 (TAT 6-24 HRS): SARS Coronavirus 2: NEGATIVE

## 2020-05-24 ENCOUNTER — Encounter (HOSPITAL_COMMUNITY)
Admission: RE | Disposition: A | Discharge status: Home / Self Care | Payer: Self-pay | Source: Home / Self Care | Attending: Gastroenterology

## 2020-05-24 ENCOUNTER — Other Ambulatory Visit: Payer: Self-pay

## 2020-05-24 ENCOUNTER — Ambulatory Visit (HOSPITAL_COMMUNITY)
Admission: RE | Admit: 2020-05-24 | Discharge: 2020-05-24 | Disposition: A | Discharge status: Home / Self Care | Payer: Medicaid Other | Attending: Gastroenterology | Admitting: Gastroenterology

## 2020-05-24 ENCOUNTER — Ambulatory Visit (HOSPITAL_COMMUNITY): Payer: Medicaid Other | Admitting: Anesthesiology

## 2020-05-24 ENCOUNTER — Encounter (HOSPITAL_COMMUNITY): Payer: Self-pay

## 2020-05-24 ENCOUNTER — Telehealth (INDEPENDENT_AMBULATORY_CARE_PROVIDER_SITE_OTHER): Payer: Self-pay | Admitting: *Deleted

## 2020-05-24 ENCOUNTER — Other Ambulatory Visit (INDEPENDENT_AMBULATORY_CARE_PROVIDER_SITE_OTHER): Payer: Self-pay | Admitting: Gastroenterology

## 2020-05-24 DIAGNOSIS — K2970 Gastritis, unspecified, without bleeding: Secondary | ICD-10-CM

## 2020-05-24 DIAGNOSIS — J45909 Unspecified asthma, uncomplicated: Secondary | ICD-10-CM | POA: Insufficient documentation

## 2020-05-24 DIAGNOSIS — K219 Gastro-esophageal reflux disease without esophagitis: Secondary | ICD-10-CM | POA: Diagnosis not present

## 2020-05-24 DIAGNOSIS — K259 Gastric ulcer, unspecified as acute or chronic, without hemorrhage or perforation: Secondary | ICD-10-CM | POA: Insufficient documentation

## 2020-05-24 DIAGNOSIS — F17200 Nicotine dependence, unspecified, uncomplicated: Secondary | ICD-10-CM | POA: Diagnosis not present

## 2020-05-24 DIAGNOSIS — Z885 Allergy status to narcotic agent status: Secondary | ICD-10-CM | POA: Diagnosis not present

## 2020-05-24 DIAGNOSIS — Z79899 Other long term (current) drug therapy: Secondary | ICD-10-CM | POA: Diagnosis not present

## 2020-05-24 DIAGNOSIS — K277 Chronic peptic ulcer, site unspecified, without hemorrhage or perforation: Secondary | ICD-10-CM | POA: Diagnosis not present

## 2020-05-24 DIAGNOSIS — B9681 Helicobacter pylori [H. pylori] as the cause of diseases classified elsewhere: Secondary | ICD-10-CM

## 2020-05-24 HISTORY — PX: BIOPSY: SHX5522

## 2020-05-24 HISTORY — PX: ESOPHAGOGASTRODUODENOSCOPY (EGD) WITH PROPOFOL: SHX5813

## 2020-05-24 SURGERY — ESOPHAGOGASTRODUODENOSCOPY (EGD) WITH PROPOFOL
Anesthesia: General

## 2020-05-24 MED ORDER — LACTATED RINGERS IV SOLN: INTRAVENOUS | Status: DC

## 2020-05-24 MED ORDER — SUCRALFATE 1 G PO TABS: 1.0000 g | ORAL_TABLET | Freq: Four times a day (QID) | ORAL | 0 refills | Status: DC

## 2020-05-24 MED ORDER — CHLORHEXIDINE GLUCONATE CLOTH 2 % EX PADS: 6.0000 | MEDICATED_PAD | Freq: Once | CUTANEOUS | Status: DC

## 2020-05-24 MED ORDER — STERILE WATER FOR IRRIGATION IR SOLN
Status: DC | PRN
  Administered 2020-05-24: 100 mL

## 2020-05-24 MED ORDER — PROPOFOL 10 MG/ML IV BOLUS
INTRAVENOUS | Status: DC | PRN
  Administered 2020-05-24: 30 mg via INTRAVENOUS
  Administered 2020-05-24: 50 mg via INTRAVENOUS

## 2020-05-24 MED ORDER — PROPOFOL 500 MG/50ML IV EMUL
INTRAVENOUS | Status: DC | PRN
  Administered 2020-05-24: 150 ug/kg/min via INTRAVENOUS

## 2020-05-24 MED ORDER — PROPOFOL 10 MG/ML IV BOLUS
INTRAVENOUS | Status: AC
  Filled 2020-05-24: qty 60

## 2020-05-24 NOTE — Op Note (Addendum)
[facility] Patient Name: Angela Kent Procedure Date: 05/24/2020 7:08 AM MRN: [id number] Date of Birth: 02/26/1985 Attending MD: Katrinka Blazing ,  CSN: [id number] Age: 35 Admit Type: Outpatient Procedure:                Upper GI endoscopy Indications:              Follow-up of chronic peptic ulcer Providers:                Katrinka Blazing, Sheran Fava, Burke Keels, Technician Referring MD:              Medicines:                Monitored Anesthesia Care Complications:            No immediate complications. Estimated Blood Loss:     Estimated blood loss: none. Procedure:                Pre-Anesthesia Assessment:                           - Prior to the procedure, a History and Physical                            was performed, and patient medications, allergies                            and sensitivities were reviewed. The patient's                            tolerance of previous anesthesia was reviewed.                           - The risks and benefits of the procedure and the                            sedation options and risks were discussed with the                            patient. All questions were answered and informed                            consent was obtained.                           - ASA Grade Assessment: II - A patient with mild                            systemic disease.                           After obtaining informed consent, the endoscope was                            passed under direct vision. Throughout the  procedure, the patient's blood pressure, pulse, and                            oxygen saturations were monitored continuously. The                            [id number]-[id number] ([id number]) was introduced through the                            mouth, and advanced to the second part of duodenum.                            The upper GI endoscopy was accomplished without                             difficulty. The patient tolerated the procedure                            well. Scope In: 7:39:14 AM Scope Out: 7:46:39 AM Total Procedure Duration: 0 hours 7 minutes 25 seconds  Findings:      The examined esophagus was normal.      One non-bleeding cratered gastric ulcer with a clean ulcer base (Forrest       Class III) was found on the posterior wall of the gastric antrum. The       lesion was 10 mm in largest dimension. Biopsies were taken from the       ulcer edge with a cold forceps for histology. There were other areas of       erythema in the antrum with presence of scars.      The examined duodenum was normal. Impression:               - Normal esophagus.                           - Non-bleeding gastric ulcer with a clean ulcer                            base (Forrest Class III). Biopsied.                           - Normal examined duodenum. Moderate Sedation:      Per Anesthesia Care Recommendation:           - Discharge patient to home (ambulatory).                           - Resume previous diet.                           - Continue present medications, including                            omeprazole 40 mg every 12 hours. Will need to stop                            medication for a week  before breath test for H.                            pylori is performed.                           - Use sucralfate tablets 1 gram PO QID for 4 weeks.                           - Await pathology results.                           - Stop smoking.                           - Repeat upper endoscopy in 3 months for                            surveillance.                           - No ibuprofen, naproxen, or other non-steroidal                            anti-inflammatory drugs.                           - Check gastrin level now. Procedure Code(s):        --- Professional ---                           [phone], Esophagogastroduodenoscopy, flexible,                             transoral; with biopsy, single or multiple Diagnosis Code(s):        --- Professional ---                           K25.9, Gastric ulcer, unspecified as acute or                            chronic, without hemorrhage or perforation                           K27.7, Chronic peptic ulcer, site unspecified,                            without hemorrhage or perforation CPT copyright 2019 American Medical Association. All rights reserved. The codes documented in this report are preliminary and upon coder review may  be revised to meet current compliance requirements. Katrinka Blazing, MD Katrinka Blazing,  05/24/2020 7:56:45 AM This report has been signed electronically. Number of Addenda: 0

## 2020-05-24 NOTE — Anesthesia Postprocedure Evaluation (Signed)
Anesthesia Post Note  Patient: Angela Kent  Procedure(s) Performed: ESOPHAGOGASTRODUODENOSCOPY (EGD) WITH PROPOFOL (N/A ) BIOPSY  Patient location during evaluation: Endoscopy Anesthesia Type: General Level of consciousness: awake and alert Pain management: pain level controlled Vital Signs Assessment: post-procedure vital signs reviewed and stable Respiratory status: spontaneous breathing Cardiovascular status: blood pressure returned to baseline and stable Postop Assessment: no apparent nausea or vomiting Anesthetic complications: no   No complications documented.   Last Vitals:  Vitals:   05/24/20 0640  BP: 102/68  Pulse: 77  Resp: 12  Temp: 36.9 C  SpO2: 99%    Last Pain:  Vitals:   05/24/20 0737  TempSrc:   PainSc: 8                  Issa Luster

## 2020-05-24 NOTE — Transfer of Care (Signed)
Immediate Anesthesia Transfer of Care Note  Patient: Angela Kent  Procedure(s) Performed: ESOPHAGOGASTRODUODENOSCOPY (EGD) WITH PROPOFOL (N/A ) BIOPSY  Patient Location: Endoscopy Unit  Anesthesia Type:General  Level of Consciousness: awake  Airway & Oxygen Therapy: Patient Spontanous Breathing  Post-op Assessment: Report given to RN  Post vital signs: Reviewed and stable  Last Vitals:  Vitals Value Taken Time  BP    Temp    Pulse    Resp    SpO2      Last Pain:  Vitals:   05/24/20 0737  TempSrc:   PainSc: 8       Patients Stated Pain Goal: 8 (05/24/20 0640)  Complications: No complications documented.

## 2020-05-24 NOTE — Telephone Encounter (Signed)
Patient needs 3 mth EGD - room 1 - gastric  ulcer

## 2020-05-24 NOTE — Anesthesia Preprocedure Evaluation (Addendum)
Anesthesia Evaluation  Patient identified by MRN, date of birth, ID band Patient awake    Reviewed: Allergy & Precautions, NPO status , Patient's Chart, lab work & pertinent test results  History of Anesthesia Complications Negative for: history of anesthetic complications  Airway Mallampati: II  TM Distance: >3 FB Neck ROM: Full    Dental  (+) Teeth Intact, Dental Advisory Given Crowns:   Pulmonary asthma , Current SmokerPatient did not abstain from smoking.,    Pulmonary exam normal breath sounds clear to auscultation       Cardiovascular Exercise Tolerance: Good Normal cardiovascular exam Rhythm:Regular Rate:Normal     Neuro/Psych PSYCHIATRIC DISORDERS Anxiety Depression negative neurological ROS     GI/Hepatic Neg liver ROS, PUD, GERD  Medicated and Controlled,  Endo/Other  negative endocrine ROS  Renal/GU negative Renal ROS     Musculoskeletal negative musculoskeletal ROS (+)   Abdominal   Peds  Hematology negative hematology ROS (+)   Anesthesia Other Findings   Reproductive/Obstetrics negative OB ROS                            Anesthesia Physical  Anesthesia Plan  ASA: II  Anesthesia Plan: General   Post-op Pain Management:    Induction: Intravenous  PONV Risk Score and Plan: TIVA  Airway Management Planned: Nasal Cannula and Natural Airway  Additional Equipment:   Intra-op Plan:   Post-operative Plan:   Informed Consent: I have reviewed the patients History and Physical, chart, labs and discussed the procedure including the risks, benefits and alternatives for the proposed anesthesia with the patient or authorized representative who has indicated his/her understanding and acceptance.     Dental advisory given  Plan Discussed with: CRNA and Surgeon  Anesthesia Plan Comments:         Anesthesia Quick Evaluation

## 2020-05-24 NOTE — Telephone Encounter (Signed)
Noted  

## 2020-05-24 NOTE — H&P (Signed)
Angela Kent is an 35 y.o. female.   Chief Complaint: follow up gastric ulcer HPI: Angela Kent is a 35 y.o. female with PMH GERD, H. Pylori, ashtma, depression, and chronic NSAID intake, who presents for follow up of gastric ulcer and epigastric abdominal pain.  Patient reports feeling well but states he has been having recurrent epigastric abdominal pain between meals for the last week or so.  Denies any nausea, vomiting, fever, chills, melena or hematochezia.  Denies taking any NSAIDs or any over-the-counter medications.  Still taking PPI twice a day.  Last EGD was performed on 02/23/2020 which showed Presence of nonbleeding gastric ulcers with a clean base in the antrum with negative biopsies for H. pylori.  Duodenum look normal.  Past Medical History:  Diagnosis Date  . Acid reflux   . Anxiety   . Asthma   . Depression   . Stomach ulcer     Past Surgical History:  Procedure Laterality Date  . APPENDECTOMY    . BIOPSY  09/25/2018   Procedure: BIOPSY;  Surgeon: Malissa Hippo, MD;  Location: [facility];  Service: Endoscopy;;  . BIOPSY  02/23/2020   Procedure: BIOPSY;  Surgeon: Dolores Frame, MD;  Location: [facility];  Service: Gastroenterology;;  . CESAREAN SECTION    . CHOLECYSTECTOMY    . ESOPHAGEAL DILATION N/A 02/23/2020   Procedure: ESOPHAGEAL DILATION;  Surgeon: Dolores Frame, MD;  Location: [facility];  Service: Gastroenterology;  Laterality: N/A;  . ESOPHAGOGASTRODUODENOSCOPY N/A 09/25/2018   Procedure: ESOPHAGOGASTRODUODENOSCOPY (EGD);  Surgeon: Malissa Hippo, MD;  Location: [facility];  Service: Endoscopy;  Laterality: N/A;  1200  . ESOPHAGOGASTRODUODENOSCOPY (EGD) WITH PROPOFOL N/A 02/23/2020   Procedure: ESOPHAGOGASTRODUODENOSCOPY (EGD) WITH PROPOFOL;  Surgeon: Dolores Frame, MD;  Location: [facility];  Service: Gastroenterology;  Laterality: N/A;  9:30  . PARTIAL HYSTERECTOMY    . TONSILLECTOMY      History reviewed.  No pertinent family history. Social History:  reports that she has been smoking. She has been smoking about 0.50 packs per day. She has never used smokeless tobacco. She reports that she does not drink alcohol and does not use drugs.  Allergies:  Allergies  Allergen Reactions  . Codeine Hives    Medications Prior to Admission  Medication Sig Dispense Refill  . escitalopram (LEXAPRO) 10 MG tablet Take 10 mg by mouth daily.     Marland Kitchen omeprazole (PRILOSEC) 40 MG capsule Take 1 capsule (40 mg total) by mouth 2 (two) times daily. 180 capsule 0  . dicyclomine (BENTYL) 10 MG capsule Take 1 capsule (10 mg total) by mouth 3 (three) times daily as needed (abd pain). (Patient taking differently: Take 10 mg by mouth 3 (three) times daily as needed (abdominal pain).) 60 capsule 1    Results for orders placed or performed during the hospital encounter of 05/23/20 (from the past 48 hour(s))  SARS CORONAVIRUS 2 (TAT 6-24 HRS) Nasopharyngeal Nasopharyngeal Swab     Status: None   Collection Time: 05/23/20  9:07 AM   Specimen: Nasopharyngeal Swab  Result Value Ref Range   SARS Coronavirus 2 NEGATIVE NEGATIVE    Comment: (NOTE) SARS-CoV-2 target nucleic acids are NOT DETECTED.  The SARS-CoV-2 RNA is generally detectable in upper and lower respiratory specimens during the acute phase of infection. Negative results do not preclude SARS-CoV-2 infection, do not rule out co-infections with other pathogens, and should not be used as the sole basis for treatment or  other patient management decisions. Negative results must be combined with clinical observations, patient history, and epidemiological information. The expected result is Negative.  Fact Sheet for Patients: HairSlick.no  Fact Sheet for Healthcare Providers: quierodirigir.com  This test is not yet approved or cleared by the [place] FDA and  has been authorized for detection and/or  diagnosis of SARS-CoV-2 by FDA under an Emergency Use Authorization (EUA). This EUA will remain  in effect (meaning this test can be used) for the duration of the COVID-19 declaration under Se ction 564(b)(1) of the Act, 21 U.S.C. section 360bbb-3(b)(1), unless the authorization is terminated or revoked sooner.  Performed at [facility], [address], [place], [place] [address]    No results found.  Review of Systems  Constitutional: Negative.   HENT: Negative.   Eyes: Negative.   Respiratory: Negative.   Cardiovascular: Negative.   Gastrointestinal: Positive for abdominal pain.  Endocrine: Negative.   Genitourinary: Negative.   Musculoskeletal: Negative.   Skin: Negative.   Allergic/Immunologic: Negative.   Neurological: Negative.   Hematological: Negative.   Psychiatric/Behavioral: Negative.     Blood pressure 102/68, pulse 77, temperature 98.5 F (36.9 C), temperature source Oral, resp. rate 12, height 5\' 5"  (1.651 m), weight 95.7 kg, SpO2 99 %. Physical Exam  GENERAL: The patient is AO x3, in no acute distress. HEENT: Head is normocephalic and atraumatic. EOMI are intact. Mouth is well hydrated and without lesions. NECK: Supple. No masses LUNGS: Clear to auscultation. No presence of rhonchi/wheezing/rales. Adequate chest expansion HEART: RRR, normal s1 and s2. ABDOMEN: Mildly tender to palpation of the epigastric area, no guarding, no peritoneal signs, and nondistended. BS +. No masses. EXTREMITIES: Without any cyanosis, clubbing, rash, lesions or edema. NEUROLOGIC: AOx3, no focal motor deficit. SKIN: no jaundice, no rashes  Assessment/Plan Angela Kent is a 35 y.o. female with PMH GERD, H. Pylori, ashtma, depression, and chronic NSAID intake, who presents for follow up of gastric ulcer and epigastric abdominal pain.  We will proceed with EGD.  34, MD 05/24/2020, 7:23 AM

## 2020-05-24 NOTE — Discharge Instructions (Signed)
You are being discharged to home.  Resume your previous diet.  Stop smoking. Continue your present medications, including omeprazole 40 mg every 12 hours. Will need to stop medication for a week before breath test for H. pylori is performed. Take Carafate (sucralfate) tablets 1 gram by mouth four times a day for four weeks.  We are waiting for your pathology results.  Your physician has recommended a repeat upper endoscopy in three months for surveillance.  Do not take any ibuprofen (including Advil, Motrin or Nuprin), naproxen, or other non-steroidal anti-inflammatory drugs.    Upper Endoscopy, Adult, Care After This sheet gives you information about how to care for yourself after your procedure. Your health care provider may also give you more specific instructions. If you have problems or questions, contact your health care provider. What can I expect after the procedure? After the procedure, it is common to have:  A sore throat.  Mild stomach pain or discomfort.  Bloating.  Nausea. Follow these instructions at home:  Follow instructions from your health care provider about what to eat or drink after your procedure.  Return to your normal activities as told by your health care provider. Ask your health care provider what activities are safe for you.  Take over-the-counter and prescription medicines only as told by your health care provider.  If you were given a sedative during the procedure, it can affect you for several hours. Do not drive or operate machinery until your health care provider says that it is safe.  Keep all follow-up visits as told by your health care provider. This is important.   Contact a health care provider if you have:  A sore throat that lasts longer than one day.  Trouble swallowing. Get help right away if:  You vomit blood or your vomit looks like coffee grounds.  You have: ? A fever. ? Bloody, black, or tarry stools. ? A severe sore throat or  you cannot swallow. ? Difficulty breathing. ? Severe pain in your chest or abdomen. Summary  After the procedure, it is common to have a sore throat, mild stomach discomfort, bloating, and nausea.  If you were given a sedative during the procedure, it can affect you for several hours. Do not drive or operate machinery until your health care provider says that it is safe.  Follow instructions from your health care provider about what to eat or drink after your procedure.  Return to your normal activities as told by your health care provider. This information is not intended to replace advice given to you by your health care provider. Make sure you discuss any questions you have with your health care provider. Document Revised: 01/27/2019 Document Reviewed: 07/01/2017 Elsevier Patient Education  2021 [employer].

## 2020-05-26 LAB — SURGICAL PATHOLOGY

## 2020-05-27 LAB — GASTRIN: Gastrin: 14 pg/mL (ref 0–115)

## 2020-05-30 ENCOUNTER — Encounter (HOSPITAL_COMMUNITY): Payer: Self-pay | Admitting: Gastroenterology

## 2020-08-09 ENCOUNTER — Encounter (INDEPENDENT_AMBULATORY_CARE_PROVIDER_SITE_OTHER): Payer: Self-pay | Admitting: *Deleted

## 2020-08-22 ENCOUNTER — Encounter (INDEPENDENT_AMBULATORY_CARE_PROVIDER_SITE_OTHER): Payer: Self-pay

## 2020-08-23 ENCOUNTER — Encounter (INDEPENDENT_AMBULATORY_CARE_PROVIDER_SITE_OTHER): Payer: Self-pay

## 2020-08-23 ENCOUNTER — Other Ambulatory Visit (INDEPENDENT_AMBULATORY_CARE_PROVIDER_SITE_OTHER): Payer: Self-pay

## 2020-08-23 DIAGNOSIS — K259 Gastric ulcer, unspecified as acute or chronic, without hemorrhage or perforation: Secondary | ICD-10-CM | POA: Insufficient documentation

## 2020-08-25 ENCOUNTER — Other Ambulatory Visit (INDEPENDENT_AMBULATORY_CARE_PROVIDER_SITE_OTHER): Payer: Self-pay

## 2020-08-26 ENCOUNTER — Encounter (HOSPITAL_COMMUNITY)
Admission: RE | Disposition: A | Discharge status: Home / Self Care | Payer: Self-pay | Source: Home / Self Care | Attending: Gastroenterology

## 2020-08-26 ENCOUNTER — Other Ambulatory Visit: Payer: Self-pay

## 2020-08-26 ENCOUNTER — Ambulatory Visit (HOSPITAL_COMMUNITY)
Admission: RE | Admit: 2020-08-26 | Discharge: 2020-08-26 | Disposition: A | Discharge status: Home / Self Care | Payer: Medicaid Other | Attending: Gastroenterology | Admitting: Gastroenterology

## 2020-08-26 ENCOUNTER — Encounter (HOSPITAL_COMMUNITY): Payer: Self-pay | Admitting: Gastroenterology

## 2020-08-26 ENCOUNTER — Ambulatory Visit (HOSPITAL_COMMUNITY): Payer: Medicaid Other | Admitting: Anesthesiology

## 2020-08-26 DIAGNOSIS — K219 Gastro-esophageal reflux disease without esophagitis: Secondary | ICD-10-CM | POA: Diagnosis not present

## 2020-08-26 DIAGNOSIS — F17210 Nicotine dependence, cigarettes, uncomplicated: Secondary | ICD-10-CM | POA: Diagnosis not present

## 2020-08-26 DIAGNOSIS — Z79899 Other long term (current) drug therapy: Secondary | ICD-10-CM | POA: Diagnosis not present

## 2020-08-26 DIAGNOSIS — K259 Gastric ulcer, unspecified as acute or chronic, without hemorrhage or perforation: Secondary | ICD-10-CM | POA: Insufficient documentation

## 2020-08-26 DIAGNOSIS — K279 Peptic ulcer, site unspecified, unspecified as acute or chronic, without hemorrhage or perforation: Secondary | ICD-10-CM | POA: Diagnosis not present

## 2020-08-26 DIAGNOSIS — J45909 Unspecified asthma, uncomplicated: Secondary | ICD-10-CM | POA: Diagnosis not present

## 2020-08-26 DIAGNOSIS — Z885 Allergy status to narcotic agent status: Secondary | ICD-10-CM | POA: Insufficient documentation

## 2020-08-26 DIAGNOSIS — Z9049 Acquired absence of other specified parts of digestive tract: Secondary | ICD-10-CM | POA: Diagnosis not present

## 2020-08-26 HISTORY — PX: BIOPSY: SHX5522

## 2020-08-26 HISTORY — PX: ESOPHAGOGASTRODUODENOSCOPY (EGD) WITH PROPOFOL: SHX5813

## 2020-08-26 SURGERY — ESOPHAGOGASTRODUODENOSCOPY (EGD) WITH PROPOFOL
Anesthesia: General

## 2020-08-26 MED ORDER — LANSOPRAZOLE 30 MG PO CPDR: 30.0000 mg | DELAYED_RELEASE_CAPSULE | Freq: Two times a day (BID) | ORAL | 5 refills | Status: DC

## 2020-08-26 MED ORDER — STERILE WATER FOR IRRIGATION IR SOLN
Status: DC | PRN
  Administered 2020-08-26: 100 mL

## 2020-08-26 MED ORDER — LACTATED RINGERS IV SOLN: INTRAVENOUS | Status: DC

## 2020-08-26 MED ORDER — PROPOFOL 10 MG/ML IV BOLUS
INTRAVENOUS | Status: DC | PRN
  Administered 2020-08-26: 20 mg via INTRAVENOUS
  Administered 2020-08-26: 50 mg via INTRAVENOUS
  Administered 2020-08-26: 30 mg via INTRAVENOUS
  Administered 2020-08-26 (×2): 50 mg via INTRAVENOUS

## 2020-08-26 NOTE — H&P (Signed)
Angela Kent is an 35 y.o. female.   Chief Complaint: follow up gastric ulcers HPI: Angela Kent is a 35 y.o. female with PMH GERD, ashtma, depression, and chronic NSAID intake, who presents for follow up of postprandial epigastric pain and follow-up of gastric ulcers.  The patient has presented persistent pain in her epigastric area despite taking PPI compliantly twice a day.  She states that the pain is worse when she has not been eating any food and improves slightly with meal intake.  Denies any nausea, vomiting, fever, chills, hematochezia.  Was previously taking any NSAIDs but states that she has not been taking this anymore.  However, he is still smoking 6 cigarettes/day.  Last EGD was performed on 05/24/2020 which showed presence nonbleeding gastric ulcer with clean base in the posterior wall of the gastric antrum (size of 10 mm) biopsies were taken from the ulcer edge which were negative for H. pylor or dysplasia .  Past Medical History:  Diagnosis Date   Acid reflux    Anxiety    Asthma    Depression    Stomach ulcer     Past Surgical History:  Procedure Laterality Date   APPENDECTOMY     BIOPSY  09/25/2018   Procedure: BIOPSY;  Surgeon: Malissa Hippo, MD;  Location: [facility];  Service: Endoscopy;;   BIOPSY  02/23/2020   Procedure: BIOPSY;  Surgeon: Dolores Frame, MD;  Location: [facility];  Service: Gastroenterology;;   BIOPSY  05/24/2020   Procedure: BIOPSY;  Surgeon: Dolores Frame, MD;  Location: [facility];  Service: Gastroenterology;;   CESAREAN SECTION     CHOLECYSTECTOMY     ESOPHAGEAL DILATION N/A 02/23/2020   Procedure: ESOPHAGEAL DILATION;  Surgeon: Dolores Frame, MD;  Location: [facility];  Service: Gastroenterology;  Laterality: N/A;   ESOPHAGOGASTRODUODENOSCOPY N/A 09/25/2018   Procedure: ESOPHAGOGASTRODUODENOSCOPY (EGD);  Surgeon: Malissa Hippo, MD;  Location: [facility];  Service: Endoscopy;  Laterality: N/A;   1200   ESOPHAGOGASTRODUODENOSCOPY (EGD) WITH PROPOFOL N/A 02/23/2020   Procedure: ESOPHAGOGASTRODUODENOSCOPY (EGD) WITH PROPOFOL;  Surgeon: Dolores Frame, MD;  Location: [facility];  Service: Gastroenterology;  Laterality: N/A;  9:30   ESOPHAGOGASTRODUODENOSCOPY (EGD) WITH PROPOFOL N/A 05/24/2020   Procedure: ESOPHAGOGASTRODUODENOSCOPY (EGD) WITH PROPOFOL;  Surgeon: Dolores Frame, MD;  Location: [facility];  Service: Gastroenterology;  Laterality: N/A;  AM   PARTIAL HYSTERECTOMY     TONSILLECTOMY      History reviewed. No pertinent family history. Social History:  reports that she has been smoking. She has been smoking an average of .5 packs per day. She has never used smokeless tobacco. She reports that she does not drink alcohol and does not use drugs.  Allergies:  Allergies  Allergen Reactions   Codeine Hives    Medications Prior to Admission  Medication Sig Dispense Refill   escitalopram (LEXAPRO) 10 MG tablet Take 10 mg by mouth daily.      omeprazole (PRILOSEC) 40 MG capsule Take 1 capsule (40 mg total) by mouth 2 (two) times daily. 180 capsule 0   sucralfate (CARAFATE) 1 g tablet Take 1 tablet (1 g total) by mouth 4 (four) times daily. 120 tablet 0   dicyclomine (BENTYL) 10 MG capsule Take 1 capsule (10 mg total) by mouth 3 (three) times daily as needed (abd pain). (Patient taking differently: Take 10 mg by mouth 3 (three) times daily as needed (abdominal pain).) 60 capsule 1  No results found for this or any previous visit (from the past 48 hour(s)). No results found.  Review of Systems  Constitutional: Negative.   HENT: Negative.    Eyes: Negative.   Respiratory: Negative.    Cardiovascular: Negative.   Gastrointestinal:  Positive for abdominal pain.  Endocrine: Negative.   Genitourinary: Negative.   Musculoskeletal: Negative.   Skin: Negative.   Allergic/Immunologic: Negative.   Neurological: Negative.   Hematological: Negative.    Psychiatric/Behavioral: Negative.     Blood pressure 117/81, pulse 80, temperature 98.6 F (37 C), temperature source Oral, resp. rate 19, height 5\' 5"  (1.651 m), weight 97.5 kg, SpO2 100 %. Physical Exam  GENERAL: The patient is AO x3, in no acute distress. HEENT: Head is normocephalic and atraumatic. EOMI are intact. Mouth is well hydrated and without lesions. NECK: Supple. No masses LUNGS: Clear to auscultation. No presence of rhonchi/wheezing/rales. Adequate chest expansion HEART: RRR, normal s1 and s2. ABDOMEN: tender to palpation in the epigastric area, no guarding, no peritoneal signs, and nondistended. BS +. No masses. EXTREMITIES: Without any cyanosis, clubbing, rash, lesions or edema. NEUROLOGIC: AOx3, no focal motor deficit. SKIN: no jaundice, no rashes  Assessment/Plan Angela Kent is a 35 y.o. female with PMH GERD, ashtma, depression, and chronic NSAID intake, who presents for follow up of postprandial epigastric pain and follow-up of gastric ulcers.  We will proceed with a repeat EGD.  36, MD 08/26/2020, 12:42 PM

## 2020-08-26 NOTE — Anesthesia Preprocedure Evaluation (Signed)
Anesthesia Evaluation  Patient identified by MRN, date of birth, ID band Patient awake    Reviewed: Allergy & Precautions, NPO status , Patient's Chart, lab work & pertinent test results  History of Anesthesia Complications Negative for: history of anesthetic complications  Airway Mallampati: II  TM Distance: >3 FB Neck ROM: Full    Dental  (+) Teeth Intact, Dental Advisory Given Crowns:   Pulmonary asthma , Current Smoker and Patient abstained from smoking.,    Pulmonary exam normal breath sounds clear to auscultation       Cardiovascular Exercise Tolerance: Good Normal cardiovascular exam Rhythm:Regular Rate:Normal     Neuro/Psych PSYCHIATRIC DISORDERS Anxiety Depression negative neurological ROS     GI/Hepatic Neg liver ROS, PUD, GERD  Medicated and Controlled,  Endo/Other  negative endocrine ROS  Renal/GU negative Renal ROS     Musculoskeletal negative musculoskeletal ROS (+)   Abdominal   Peds  Hematology negative hematology ROS (+)   Anesthesia Other Findings   Reproductive/Obstetrics negative OB ROS                             Anesthesia Physical  Anesthesia Plan  ASA: II  Anesthesia Plan: General   Post-op Pain Management:    Induction: Intravenous  PONV Risk Score and Plan: TIVA  Airway Management Planned: Nasal Cannula and Natural Airway  Additional Equipment:   Intra-op Plan:   Post-operative Plan:   Informed Consent: I have reviewed the patients History and Physical, chart, labs and discussed the procedure including the risks, benefits and alternatives for the proposed anesthesia with the patient or authorized representative who has indicated his/her understanding and acceptance.     Dental advisory given  Plan Discussed with: CRNA and Surgeon  Anesthesia Plan Comments:         Anesthesia Quick Evaluation

## 2020-08-26 NOTE — Discharge Instructions (Signed)
You are being discharged to home.  Resume your previous diet.  We are waiting for your pathology results.  Your physician has recommended a repeat upper endoscopy in six months for surveillance.  Continue your present medications, including Carafate. Do not take any Goody/BC powders, ibuprofen (including Advil, Motrin or Nuprin), naproxen, or other non-steroidal anti-inflammatory drugs.  Start Prevacid 30 mg twice a day, stop Prilosec.

## 2020-08-26 NOTE — Anesthesia Postprocedure Evaluation (Signed)
Anesthesia Post Note  Patient: Angela Kent  Procedure(s) Performed: ESOPHAGOGASTRODUODENOSCOPY (EGD) WITH PROPOFOL BIOPSY  Patient location during evaluation: Endoscopy Anesthesia Type: General Level of consciousness: awake and alert Pain management: pain level controlled Vital Signs Assessment: post-procedure vital signs reviewed and stable Respiratory status: spontaneous breathing Cardiovascular status: blood pressure returned to baseline and stable Postop Assessment: no apparent nausea or vomiting Anesthetic complications: no   No notable events documented.   Last Vitals:  Vitals:   08/26/20 1221 08/26/20 1401  BP: 117/81 102/74  Pulse: 80 85  Resp: 19 13  Temp: 37 C 36.7 C  SpO2: 100% 98%    Last Pain:  Vitals:   08/26/20 1401  TempSrc: Oral  PainSc: 8                  Jeorge Reister

## 2020-08-26 NOTE — Op Note (Signed)
[facility] Patient Name: Angela Kent Procedure Date: 08/26/2020 1:38 PM MRN: [id number] Date of Birth: Dec 04, 1985 Attending MD: Katrinka Blazing ,  CSN: [id number] Age: 35 Admit Type: Outpatient Procedure:                Upper GI endoscopy Indications:              Follow-up of peptic ulcer Providers:                Katrinka Blazing, Sheran Fava, Pandora Leiter,                            Technician Referring MD:              Medicines:                Monitored Anesthesia Care Complications:            No immediate complications. Estimated Blood Loss:     Estimated blood loss: none. Procedure:                Pre-Anesthesia Assessment:                           - Prior to the procedure, a History and Physical                            was performed, and patient medications, allergies                            and sensitivities were reviewed. The patient's                            tolerance of previous anesthesia was reviewed.                           - The risks and benefits of the procedure and the                            sedation options and risks were discussed with the                            patient. All questions were answered and informed                            consent was obtained.                           - ASA Grade Assessment: II - A patient with mild                            systemic disease.                           After obtaining informed consent, the endoscope was                            passed under direct vision. Throughout the  procedure, the patient's blood pressure, pulse, and                            oxygen saturations were monitored continuously. The                            [id number]-H190 ([id number]) scope was introduced through the                            mouth, and advanced to the second part of duodenum.                            The upper GI endoscopy was accomplished without                             difficulty. The patient tolerated the procedure                            well. Scope In: 1:47:47 PM Scope Out: 1:54:24 PM Total Procedure Duration: 0 hours 6 minutes 37 seconds  Findings:      The Z-line was regular and was found 35 cm from the incisors.      The examined esophagus was normal.      Five non-bleeding superficial gastric ulcers with a flat pigmented spot       (Forrest Class IIc) were found in the gastric antrum. The largest lesion       was 8 mm in largest dimension. There were some small erosions as well.       Biopsies were taken with a cold forceps for Helicobacter pylori testing.      The examined duodenum was normal. Impression:               - Z-line regular, 35 cm from the incisors.                           - Normal esophagus.                           - Non-bleeding gastric ulcers with a flat pigmented                            spot (Forrest Class IIc). Erosions. Biopsied.                           - Normal examined duodenum. Moderate Sedation:      Per Anesthesia Care Recommendation:           - Discharge patient to home (ambulatory).                           - Resume previous diet.                           - Await pathology results.                           - Repeat upper endoscopy in 6 months  for                            surveillance.                           - Continue present medications, including Carafate.                           - Start Prevacid 30 mg twice a day, stop Prilosec.                           - No Goody/BC powders, ibuprofen, naproxen, or                            other non-steroidal anti-inflammatory drugs. Procedure Code(s):        --- Professional ---                           [phone], Esophagogastroduodenoscopy, flexible,                            transoral; with biopsy, single or multiple Diagnosis Code(s):        --- Professional ---                           K25.9, Gastric ulcer, unspecified as acute or                             chronic, without hemorrhage or perforation                           K27.9, Peptic ulcer, site unspecified, unspecified                            as acute or chronic, without hemorrhage or                            perforation CPT copyright 2019 American Medical Association. All rights reserved. The codes documented in this report are preliminary and upon coder review may  be revised to meet current compliance requirements. Katrinka Blazing, MD Katrinka Blazing,  08/26/2020 2:08:19 PM This report has been signed electronically. Number of Addenda: 0

## 2020-08-26 NOTE — Transfer of Care (Signed)
Immediate Anesthesia Transfer of Care Note  Patient: Angela Kent  Procedure(s) Performed: ESOPHAGOGASTRODUODENOSCOPY (EGD) WITH PROPOFOL BIOPSY  Patient Location: Endoscopy Unit  Anesthesia Type:General  Level of Consciousness: awake  Airway & Oxygen Therapy: Patient Spontanous Breathing  Post-op Assessment: Report given to RN  Post vital signs: Reviewed  Last Vitals:  Vitals Value Taken Time  BP 102/74 08/26/20 1401  Temp 36.7 C 08/26/20 1401  Pulse 85 08/26/20 1401  Resp 13 08/26/20 1401  SpO2 98 % 08/26/20 1401    Last Pain:  Vitals:   08/26/20 1401  TempSrc: Oral  PainSc: 0-No pain      Patients Stated Pain Goal: 8 (08/26/20 1221)  Complications: No notable events documented.

## 2020-08-30 ENCOUNTER — Encounter (INDEPENDENT_AMBULATORY_CARE_PROVIDER_SITE_OTHER): Payer: Self-pay | Admitting: *Deleted

## 2020-08-30 LAB — SURGICAL PATHOLOGY

## 2020-09-02 ENCOUNTER — Encounter (HOSPITAL_COMMUNITY): Payer: Self-pay | Admitting: Gastroenterology

## 2020-09-08 ENCOUNTER — Telehealth (INDEPENDENT_AMBULATORY_CARE_PROVIDER_SITE_OTHER): Payer: Self-pay

## 2020-09-08 ENCOUNTER — Other Ambulatory Visit (INDEPENDENT_AMBULATORY_CARE_PROVIDER_SITE_OTHER): Payer: Self-pay | Admitting: Gastroenterology

## 2020-09-08 DIAGNOSIS — R1013 Epigastric pain: Secondary | ICD-10-CM

## 2020-09-08 NOTE — Telephone Encounter (Signed)
Thanks, please let her know about this. If severe pain that can't wait, will need to go to the ER

## 2020-09-08 NOTE — Telephone Encounter (Signed)
Patient aware.

## 2020-09-08 NOTE — Telephone Encounter (Signed)
Spoke with the patient today, she reports that her abdominal pain was controlled since her last endoscopy but became severe in the epigastric area for the last 3 days.  States that she has been taking the Prevacid and Carafate compliantly as well as dicyclomine without any relief.  We will need to evaluate this with a CT abdomen and pelvis with IV contrast ASAP.  Please arrange for this

## 2020-09-08 NOTE — Telephone Encounter (Signed)
Patient called today stating she had a EGD on 08/26/2020 and she has been having some stomach pains "squeezing " for the last two days. The pain is so intense she is unable to eat or sleep. She says she has a history of ulcers,but feels different. She has some issues with nausea, no vomiting fever nor chills, no diarrhea, constipation or dark or bloody stools are noted. She is taking Carafate four times per day as needed and Dicyclomine 10 mg as needed. She also is taking Lansoprazole 30 mg one BID. Please advise. Thanks,

## 2020-09-12 NOTE — Telephone Encounter (Signed)
PA has been submitted, gave info to Soledad Gerlach to schedule once PA obtained

## 2020-09-17 ENCOUNTER — Encounter (INDEPENDENT_AMBULATORY_CARE_PROVIDER_SITE_OTHER): Payer: Self-pay

## 2020-10-04 ENCOUNTER — Telehealth (INDEPENDENT_AMBULATORY_CARE_PROVIDER_SITE_OTHER): Payer: Self-pay

## 2020-10-04 ENCOUNTER — Encounter (INDEPENDENT_AMBULATORY_CARE_PROVIDER_SITE_OTHER): Payer: Self-pay

## 2020-10-04 NOTE — Telephone Encounter (Signed)
Patient called today stating she is still having a lot of RUQ pain and ulcers. She states she does not take NSAIDS and does not understand where they are coming from. She would like to have a repeat EGD even though she just had one on 08/26/2020. She has a hx of H Pylori, and the test that was ordered for H Pylori in April patient states she was told she did not have to do as she has had H Pylori in the past. Please advise.

## 2020-10-04 NOTE — Telephone Encounter (Signed)
Called patient today to discuss her symptoms.  Unfortunately she did not pick up the phone, left a voice message asking to call back to discuss her symptoms.  It is unlikely that ulcers are causing the pain along as they are very superficial.  Unfortunately she has not been able to improve with the use of PPIs and the last step for management of persistent peptic ulcer disease will be a partial gastrectomy, which I consider is quite aggressive for the ulcers she is presenting.  She will need to completely quit smoking which I have discussed in the past with her.  Also, it is important to make sure she does not have H. pylori and that is the reason why I ordered this test which she needs to take off PPI for 2 weeks.

## 2020-10-04 NOTE — Telephone Encounter (Signed)
Hi,  Just called you, please call tomorrow after 330 PM, I'll be in the office at that time Thanks

## 2020-10-05 NOTE — Telephone Encounter (Signed)
Dr. Levon Hedger sent a mychart message to pt on 8/23 asking pt to call back today 8/24 after 3:30.

## 2020-10-07 NOTE — Telephone Encounter (Signed)
I spoke with the patient and she is aware of all. She states she has quit smoking and She is aware to have the H pylori done after being off of ppi for at least two weeks.( I have mailed the order to her).  She Also states she is contemplating the gastrectomy as the pain is too much at times. I advised that you said this was extreme and she states she will look in to it more and make her mind up, but would like a call back from you to discuss.

## 2020-10-07 NOTE — Telephone Encounter (Signed)
I called the patient, she reported her abdominal pain was getting worse.  She has been compliant with her PPI and Carafate, has not been smoking since the last time she was seen.  We can schedule an EGD for evaluation of persistent epigastric pain.  She can have this on Tuesday. Soledad Gerlach, can you please schedule her for an EGD on Tuesday 8/30?   Thanks,  Katrinka Blazing, MD Gastroenterology and Hepatology [facility]

## 2020-10-10 ENCOUNTER — Other Ambulatory Visit (INDEPENDENT_AMBULATORY_CARE_PROVIDER_SITE_OTHER): Payer: Self-pay

## 2020-10-10 ENCOUNTER — Encounter (INDEPENDENT_AMBULATORY_CARE_PROVIDER_SITE_OTHER): Payer: Self-pay

## 2020-10-10 DIAGNOSIS — R1013 Epigastric pain: Secondary | ICD-10-CM

## 2020-10-13 NOTE — Patient Instructions (Signed)
Angela Kent  10/13/2020     @PREFPERIOPPHARMACY @   Your procedure is scheduled on  10/18/2020.   Report to 12/18/2020 at  0700 A.M.   Call this number if you have problems the morning of surgery:  [phone]   Remember:  Follow the diet instructions given to you by the office.    Take these medicines the morning of surgery with A SIP OF WATER           buspar, zyrtec, lexapro, gabapentin, hydroxyzine, prevacid      Do not wear jewelry, make-up or nail polish.  Do not wear lotions, powders, or perfumes, or deodorant.  Do not shave 48 hours prior to surgery.  Men may shave face and neck.  Do not bring valuables to the hospital.  [facility] is not responsible for any belongings or valuables.  Contacts, dentures or bridgework may not be worn into surgery.  Leave your suitcase in the car.  After surgery it may be brought to your room.  For patients admitted to the hospital, discharge time will be determined by your treatment team.  Patients discharged the day of surgery will not be allowed to drive home and must have someone with them for 24 hours.    Special instructions:   DO NOT smoke tobacco or vape for 24 hours before your procedure.  Please read over the following fact sheets that you were given. Anesthesia Post-op Instructions and Care and Recovery After Surgery      Upper Endoscopy, Adult, Care After This sheet gives you information about how to care for yourself after your procedure. Your health care provider may also give you more specific instructions. If you have problems or questions, contact your health care provider. What can I expect after the procedure? After the procedure, it is common to have: A sore throat. Mild stomach pain or discomfort. Bloating. Nausea. Follow these instructions at home:  Follow instructions from your health care provider about what to eat or drink after your procedure. Return to your normal activities as told by  your health care provider. Ask your health care provider what activities are safe for you. Take over-the-counter and prescription medicines only as told by your health care provider. If you were given a sedative during the procedure, it can affect you for several hours. Do not drive or operate machinery until your health care provider says that it is safe. Keep all follow-up visits as told by your health care provider. This is important. Contact a health care provider if you have: A sore throat that lasts longer than one day. Trouble swallowing. Get help right away if: You vomit blood or your vomit looks like coffee grounds. You have: A fever. Bloody, black, or tarry stools. A severe sore throat or you cannot swallow. Difficulty breathing. Severe pain in your chest or abdomen. Summary After the procedure, it is common to have a sore throat, mild stomach discomfort, bloating, and nausea. If you were given a sedative during the procedure, it can affect you for several hours. Do not drive or operate machinery until your health care provider says that it is safe. Follow instructions from your health care provider about what to eat or drink after your procedure. Return to your normal activities as told by your health care provider. This information is not intended to replace advice given to you by your health care provider. Make sure you discuss any questions you have with your  health care provider. Document Revised: 01/27/2019 Document Reviewed: 07/01/2017 Elsevier Patient Education  2022 [employer] Monitored Anesthesia Care, Care After This sheet gives you information about how to care for yourself after your procedure. Your health care provider may also give you more specific instructions. If you have problems or questions, contact your health care provider. What can I expect after the procedure? After the procedure, it is common to have: Tiredness. Forgetfulness about what happened  after the procedure. Impaired judgment for important decisions. Nausea or vomiting. Some difficulty with balance. Follow these instructions at home: For the time period you were told by your health care provider:   Rest as needed. Do not participate in activities where you could fall or become injured. Do not drive or use machinery. Do not drink alcohol. Do not take sleeping pills or medicines that cause drowsiness. Do not make important decisions or sign legal documents. Do not take care of children on your own. Eating and drinking Follow the diet that is recommended by your health care provider. Drink enough fluid to keep your urine pale yellow. If you vomit: Drink water, juice, or soup when you can drink without vomiting. Make sure you have little or no nausea before eating solid foods. General instructions Have a responsible adult stay with you for the time you are told. It is important to have someone help care for you until you are awake and alert. Take over-the-counter and prescription medicines only as told by your health care provider. If you have sleep apnea, surgery and certain medicines can increase your risk for breathing problems. Follow instructions from your health care provider about wearing your sleep device: Anytime you are sleeping, including during daytime naps. While taking prescription pain medicines, sleeping medicines, or medicines that make you drowsy. Avoid smoking. Keep all follow-up visits as told by your health care provider. This is important. Contact a health care provider if: You keep feeling nauseous or you keep vomiting. You feel light-headed. You are still sleepy or having trouble with balance after 24 hours. You develop a rash. You have a fever. You have redness or swelling around the IV site. Get help right away if: You have trouble breathing. You have new-onset confusion at home. Summary For several hours after your procedure, you may feel  tired. You may also be forgetful and have poor judgment. Have a responsible adult stay with you for the time you are told. It is important to have someone help care for you until you are awake and alert. Rest as told. Do not drive or operate machinery. Do not drink alcohol or take sleeping pills. Get help right away if you have trouble breathing, or if you suddenly become confused. This information is not intended to replace advice given to you by your health care provider. Make sure you discuss any questions you have with your health care provider. Document Revised: 10/15/2019 Document Reviewed: 01/01/2019 Elsevier Patient Education  2022 [employer].

## 2020-10-14 ENCOUNTER — Encounter (HOSPITAL_COMMUNITY): Payer: Self-pay

## 2020-10-14 ENCOUNTER — Encounter (HOSPITAL_COMMUNITY)
Admission: RE | Admit: 2020-10-14 | Discharge: 2020-10-14 | Disposition: A | Discharge status: Home / Self Care | Payer: Medicaid Other | Source: Ambulatory Visit | Attending: Gastroenterology | Admitting: Gastroenterology

## 2020-10-14 ENCOUNTER — Other Ambulatory Visit: Payer: Self-pay

## 2020-10-18 ENCOUNTER — Other Ambulatory Visit: Payer: Self-pay

## 2020-10-18 ENCOUNTER — Encounter (HOSPITAL_COMMUNITY)
Admission: RE | Disposition: A | Discharge status: Home / Self Care | Payer: Self-pay | Source: Home / Self Care | Attending: Gastroenterology

## 2020-10-18 ENCOUNTER — Ambulatory Visit (HOSPITAL_COMMUNITY)
Admission: RE | Admit: 2020-10-18 | Discharge: 2020-10-18 | Disposition: A | Discharge status: Home / Self Care | Payer: Medicaid Other | Attending: Gastroenterology | Admitting: Gastroenterology

## 2020-10-18 ENCOUNTER — Ambulatory Visit (HOSPITAL_COMMUNITY): Payer: Medicaid Other | Admitting: Anesthesiology

## 2020-10-18 ENCOUNTER — Encounter (HOSPITAL_COMMUNITY): Payer: Self-pay | Admitting: Gastroenterology

## 2020-10-18 DIAGNOSIS — Z885 Allergy status to narcotic agent status: Secondary | ICD-10-CM | POA: Diagnosis not present

## 2020-10-18 DIAGNOSIS — K277 Chronic peptic ulcer, site unspecified, without hemorrhage or perforation: Secondary | ICD-10-CM | POA: Diagnosis not present

## 2020-10-18 DIAGNOSIS — K259 Gastric ulcer, unspecified as acute or chronic, without hemorrhage or perforation: Secondary | ICD-10-CM | POA: Diagnosis not present

## 2020-10-18 DIAGNOSIS — R1013 Epigastric pain: Secondary | ICD-10-CM | POA: Diagnosis not present

## 2020-10-18 DIAGNOSIS — K3189 Other diseases of stomach and duodenum: Secondary | ICD-10-CM | POA: Diagnosis not present

## 2020-10-18 DIAGNOSIS — F32A Depression, unspecified: Secondary | ICD-10-CM | POA: Diagnosis not present

## 2020-10-18 DIAGNOSIS — Z79899 Other long term (current) drug therapy: Secondary | ICD-10-CM | POA: Diagnosis not present

## 2020-10-18 DIAGNOSIS — K219 Gastro-esophageal reflux disease without esophagitis: Secondary | ICD-10-CM | POA: Insufficient documentation

## 2020-10-18 DIAGNOSIS — Z791 Long term (current) use of non-steroidal anti-inflammatories (NSAID): Secondary | ICD-10-CM | POA: Diagnosis not present

## 2020-10-18 HISTORY — PX: BIOPSY: SHX5522

## 2020-10-18 HISTORY — PX: ESOPHAGOGASTRODUODENOSCOPY (EGD) WITH PROPOFOL: SHX5813

## 2020-10-18 SURGERY — ESOPHAGOGASTRODUODENOSCOPY (EGD) WITH PROPOFOL
Anesthesia: General

## 2020-10-18 MED ORDER — LANSOPRAZOLE 30 MG PO CPDR: 60.0000 mg | DELAYED_RELEASE_CAPSULE | Freq: Two times a day (BID) | ORAL | 5 refills | Status: DC

## 2020-10-18 MED ORDER — CHLORHEXIDINE GLUCONATE CLOTH 2 % EX PADS: 6.0000 | MEDICATED_PAD | Freq: Once | CUTANEOUS | Status: DC

## 2020-10-18 MED ORDER — LIDOCAINE HCL (CARDIAC) PF 100 MG/5ML IV SOSY
PREFILLED_SYRINGE | INTRAVENOUS | Status: DC | PRN
  Administered 2020-10-18: 100 mg via INTRAVENOUS

## 2020-10-18 MED ORDER — LACTATED RINGERS IV SOLN: INTRAVENOUS | Status: DC

## 2020-10-18 MED ORDER — PROPOFOL 10 MG/ML IV BOLUS
INTRAVENOUS | Status: DC | PRN
  Administered 2020-10-18: 150 mg via INTRAVENOUS
  Administered 2020-10-18: 50 mg via INTRAVENOUS
  Administered 2020-10-18: 150 ug/kg/min via INTRAVENOUS

## 2020-10-18 MED ORDER — STERILE WATER FOR IRRIGATION IR SOLN
Status: DC | PRN
  Administered 2020-10-18: 100 mL

## 2020-10-18 NOTE — Op Note (Signed)
[facility] Patient Name: Angela Kent Procedure Date: 10/18/2020 8:28 AM MRN: [id number] Date of Birth: [birth year] Attending MD: Katrinka Blazing ,  CSN: [id number] Age: 35 Admit Type: Outpatient Procedure:                Upper GI endoscopy Indications:              Follow-up of chronic peptic ulcer Providers:                Katrinka Blazing, Sheran Fava, Volanda Napoleon., Technician Referring MD:              Medicines:                Monitored Anesthesia Care Complications:            No immediate complications. Estimated Blood Loss:     Estimated blood loss: none. Procedure:                Pre-Anesthesia Assessment:                           - Prior to the procedure, a History and Physical                            was performed, and patient medications, allergies                            and sensitivities were reviewed. The patient's                            tolerance of previous anesthesia was reviewed.                           - The risks and benefits of the procedure and the                            sedation options and risks were discussed with the                            patient. All questions were answered and informed                            consent was obtained.                           - ASA Grade Assessment: II - A patient with mild                            systemic disease.                           After obtaining informed consent, the endoscope was                            passed under direct vision. Throughout the  procedure, the patient's blood pressure, pulse, and                            oxygen saturations were monitored continuously. The                            GIF-H190 ([id number]) scope was introduced through the                            mouth, and advanced to the second part of duodenum.                            The upper GI endoscopy was accomplished without                             difficulty. The patient tolerated the procedure                            well. Scope In: 8:35:22 AM Scope Out: 8:41:46 AM Total Procedure Duration: 0 hours 6 minutes 24 seconds  Findings:      The examined esophagus was normal.      Three non-obstructing non-bleeding superficial gastric ulcers with a       clean ulcer base (Forrest Class III) were found in the gastric antrum.       The largest lesion was 5 mm in largest dimension. There were also some       associated erosions in the gastric body without presence of active       bleeding or stigmata of recent bleeding. Biopsies from body and antrum       were taken with a cold forceps for Helicobacter pylori testing.      The examined duodenum was normal. Impression:               - Normal esophagus.                           - Non-obstructing non-bleeding gastric ulcers with                            a clean ulcer base (Forrest Class III). Biopsied.                           - Normal examined duodenum. Moderate Sedation:      Per Anesthesia Care Recommendation:           - Discharge patient to home (ambulatory).                           - Resume previous diet.                           - Await pathology results.                           - Increase Prevacid 60 mg every 12 hours                           -  Continue sucralfate 1 g every 6 hours                           - Perform hydrogen breqath test to confirm H.                            pylori erradication (will need to be off PPI for 2                            weeks)                           - If negative breath test will consider referral                            for [facility].                           - Smoking cessation                           - Follow up in GI clinic in 6 weeks.                           - Await pathology results. Procedure Code(s):        --- Professional ---                           [id number], Esophagogastroduodenoscopy, flexible,                             transoral; with biopsy, single or multiple Diagnosis Code(s):        --- Professional ---                           K25.9, Gastric ulcer, unspecified as acute or                            chronic, without hemorrhage or perforation                           K27.7, Chronic peptic ulcer, site unspecified,                            without hemorrhage or perforation CPT copyright 2019 [employer]. All rights reserved. The codes documented in this report are preliminary and upon coder review may  be revised to meet current compliance requirements. Katrinka Blazing, MD Katrinka Blazing,  10/18/2020 8:49:12 AM This report has been signed electronically. Number of Addenda: 0

## 2020-10-18 NOTE — Anesthesia Postprocedure Evaluation (Signed)
Anesthesia Post Note  Patient: Angela Kent  Procedure(s) Performed: ESOPHAGOGASTRODUODENOSCOPY (EGD) WITH PROPOFOL BIOPSY  Patient location during evaluation: Phase II Anesthesia Type: General Level of consciousness: awake and alert and oriented Pain management: pain level controlled Vital Signs Assessment: post-procedure vital signs reviewed and stable Respiratory status: spontaneous breathing and respiratory function stable Cardiovascular status: blood pressure returned to baseline and stable Postop Assessment: no apparent nausea or vomiting Anesthetic complications: no   No notable events documented.   Last Vitals:  Vitals:   10/18/20 0843 10/18/20 0900  BP: (!) 75/46 109/62  Resp: 12   Temp: 36.9 C   SpO2: 96%     Last Pain:  Vitals:   10/18/20 0843  TempSrc: Axillary  PainSc:                  Pierce Barocio C Kanin Lia

## 2020-10-18 NOTE — H&P (Signed)
Angela Kent is an 35 y.o. female.   Chief Complaint: Epigastric pain HPI: Angela Kent is a 35 y.o. female with PMH GERD, ashtma, depression, and chronic NSAID intake, who presents for follow up of postprandial epigastric pain and follow-up of gastric ulcers.  The patient has presented persistent episodes of epigastric pain despite taking Prevacid 30 mg twice a day and Carafate.  He has quit smoking but still is presenting with pain.  Denies any lightheadedness or dizziness.  No melena or hematochezia, nausea or vomiting.  Last EGD was performed on 08/26/2020 which showed presence of 5 Angela Kent is a 35 y.o. female with PMH GERD, ashtma, depression, and chronic NSAID intake, who presents for follow up of postprandial epigastric pain and follow-up of gastric ulcers. Neg Bx for HP.  Past Medical History:  Diagnosis Date   Acid reflux    Anxiety    Asthma    Depression    Stomach ulcer     Past Surgical History:  Procedure Laterality Date   APPENDECTOMY     BIOPSY  09/25/2018   Procedure: BIOPSY;  Surgeon: Malissa Hippo, MD;  [facility]: [facility];  Service: Endoscopy;;   BIOPSY  02/23/2020   Procedure: BIOPSY;  Surgeon: Dolores Frame, MD;  Location: [facility];  Service: Gastroenterology;;   BIOPSY  05/24/2020   Procedure: BIOPSY;  Surgeon: Dolores Frame, MD;  Location: [facility];  Service: Gastroenterology;;   BIOPSY  08/26/2020   Procedure: BIOPSY;  Surgeon: Dolores Frame, MD;  Location: [facility];  Service: Gastroenterology;;   CESAREAN SECTION     CHOLECYSTECTOMY     ESOPHAGEAL DILATION N/A 02/23/2020   Procedure: ESOPHAGEAL DILATION;  Surgeon: Dolores Frame, MD;  [facility]: [facility];  Service: Gastroenterology;  Laterality: N/A;   ESOPHAGOGASTRODUODENOSCOPY N/A 09/25/2018   Procedure: ESOPHAGOGASTRODUODENOSCOPY (EGD);  Surgeon: Malissa Hippo, MD;  [facility]: [facility];  Service: Endoscopy;  Laterality: N/A;   1200   ESOPHAGOGASTRODUODENOSCOPY (EGD) WITH PROPOFOL N/A 02/23/2020   Procedure: ESOPHAGOGASTRODUODENOSCOPY (EGD) WITH PROPOFOL;  Surgeon: Dolores Frame, MD;  [facility]: [facility];  Service: Gastroenterology;  Laterality: N/A;  9:30   ESOPHAGOGASTRODUODENOSCOPY (EGD) WITH PROPOFOL N/A 05/24/2020   Procedure: ESOPHAGOGASTRODUODENOSCOPY (EGD) WITH PROPOFOL;  Surgeon: Dolores Frame, MD;  [facility]: [facility];  Service: Gastroenterology;  Laterality: N/A;  AM   ESOPHAGOGASTRODUODENOSCOPY (EGD) WITH PROPOFOL N/A 08/26/2020   Procedure: ESOPHAGOGASTRODUODENOSCOPY (EGD) WITH PROPOFOL;  Surgeon: Dolores Frame, MD;  Location: [facility];  Service: Gastroenterology;  Laterality: N/A;  1:45   PARTIAL HYSTERECTOMY     TONSILLECTOMY      History reviewed. No pertinent family history. Social History:  reports that she has been smoking. She has been smoking an average of .5 packs per day. She has never used smokeless tobacco. She reports that she does not drink alcohol and does not use drugs.  Allergies:  Allergies  Allergen Reactions   Codeine Hives    Medications Prior to Admission  Medication Sig Dispense Refill   acetaminophen (TYLENOL) 325 MG tablet Take 650 mg by mouth every 6 (six) hours as needed for moderate pain or headache.     busPIRone (BUSPAR) 10 MG tablet Take 10 mg by mouth 2 (two) times daily as needed for anxiety.     cetirizine (ZYRTEC) 10 MG tablet Take 10 mg by mouth daily.     dicyclomine (BENTYL) 10 MG capsule Take 1 capsule (10  mg total) by mouth 3 (three) times daily as needed (abd pain). 60 capsule 1   escitalopram (LEXAPRO) 20 MG tablet Take 20 mg by mouth daily.     gabapentin (NEURONTIN) 300 MG capsule Take 300 mg by mouth at bedtime as needed (pain).     hydrOXYzine (VISTARIL) 25 MG capsule Take 25 mg by mouth 2 (two) times daily as needed for anxiety.     lansoprazole (PREVACID) 30 MG capsule Take 1 capsule (30 mg total) by mouth  2 (two) times daily before a meal. 60 capsule 5   sucralfate (CARAFATE) 1 g tablet Take 1 tablet (1 g total) by mouth 4 (four) times daily. 120 tablet 0   traZODone (DESYREL) 100 MG tablet Take 50-100 mg by mouth at bedtime.      No results found for this or any previous visit (from the past 48 hour(s)). No results found.  Review of Systems  Constitutional: Negative.   HENT: Negative.    Eyes: Negative.   Respiratory: Negative.    Cardiovascular: Negative.   Gastrointestinal:  Positive for abdominal pain.  Endocrine: Negative.   Genitourinary: Negative.   Musculoskeletal: Negative.   Skin: Negative.   Allergic/Immunologic: Negative.   Neurological: Negative.   Hematological: Negative.   Psychiatric/Behavioral: Negative.     Blood pressure 114/75, temperature 98.5 F (36.9 C), temperature source Oral, resp. rate 16, height 5\' 5"  (1.651 m), weight 99.8 kg, SpO2 99 %. Physical Exam  GENERAL: The patient is AO x3, in no acute distress. HEENT: Head is normocephalic and atraumatic. EOMI are intact. Mouth is well hydrated and without lesions. NECK: Supple. No masses LUNGS: Clear to auscultation. No presence of rhonchi/wheezing/rales. Adequate chest expansion HEART: RRR, normal s1 and s2. ABDOMEN: tender in epigastrium, no guarding, no peritoneal signs, and nondistended. BS +. No masses. EXTREMITIES: Without any cyanosis, clubbing, rash, lesions or edema. NEUROLOGIC: AOx3, no focal motor deficit. SKIN: no jaundice, no rashes  Assessment/Plan Angela Kent is a 35 y.o. female with PMH GERD, ashtma, depression, and chronic NSAID intake, who presents for follow up of postprandial epigastric pain and follow-up of gastric ulcers.We will proceed with EGD.  34, MD 10/18/2020, 7:41 AM

## 2020-10-18 NOTE — Transfer of Care (Signed)
Immediate Anesthesia Transfer of Care Note  Patient: Angela Kent  Procedure(s) Performed: ESOPHAGOGASTRODUODENOSCOPY (EGD) WITH PROPOFOL BIOPSY  Patient Location: Short Stay  Anesthesia Type:General  Level of Consciousness: awake, alert , oriented and patient cooperative  Airway & Oxygen Therapy: Patient Spontanous Breathing  Post-op Assessment: Report given to RN, Post -op Vital signs reviewed and stable and Patient moving all extremities X 4  Post vital signs: Reviewed and stable  Last Vitals:  Vitals Value Taken Time  BP    Temp    Pulse    Resp    SpO2      Last Pain:  Vitals:   10/18/20 0831  TempSrc:   PainSc: 7       Patients Stated Pain Goal: 9 (10/18/20 0705)  Complications: No notable events documented.

## 2020-10-18 NOTE — Anesthesia Preprocedure Evaluation (Signed)
Anesthesia Evaluation  Patient identified by MRN, date of birth, ID band Patient awake    Reviewed: Allergy & Precautions, NPO status , Patient's Chart, lab work & pertinent test results  History of Anesthesia Complications Negative for: history of anesthetic complications  Airway Mallampati: II  TM Distance: >3 FB Neck ROM: Full    Dental  (+) Dental Advisory Given Crowns:   Pulmonary asthma , Current SmokerPatient did not abstain from smoking.,    Pulmonary exam normal breath sounds clear to auscultation       Cardiovascular Exercise Tolerance: Good Normal cardiovascular exam Rhythm:Regular Rate:Normal     Neuro/Psych PSYCHIATRIC DISORDERS Anxiety Depression negative neurological ROS     GI/Hepatic Neg liver ROS, PUD, GERD  Medicated and Poorly Controlled,  Endo/Other  negative endocrine ROS  Renal/GU negative Renal ROS     Musculoskeletal negative musculoskeletal ROS (+)   Abdominal   Peds  Hematology negative hematology ROS (+)   Anesthesia Other Findings   Reproductive/Obstetrics negative OB ROS                             Anesthesia Physical  Anesthesia Plan  ASA: 2  Anesthesia Plan: General   Post-op Pain Management:    Induction: Intravenous  PONV Risk Score and Plan: Propofol infusion  Airway Management Planned: Nasal Cannula and Natural Airway  Additional Equipment:   Intra-op Plan:   Post-operative Plan:   Informed Consent: I have reviewed the patients History and Physical, chart, labs and discussed the procedure including the risks, benefits and alternatives for the proposed anesthesia with the patient or authorized representative who has indicated his/her understanding and acceptance.     Dental advisory given  Plan Discussed with: CRNA and Surgeon  Anesthesia Plan Comments:         Anesthesia Quick Evaluation

## 2020-10-18 NOTE — Discharge Instructions (Signed)
You are being discharged to home.  Resume your previous diet.  We are waiting for your pathology results.  Increase Prevacid 60 mg every 12 hours Continue sucralfate 1 g every 6 hours Perform hydrogen breqath test to confirm H. pylori erradication (will need to be off PPI for 2 weeks) If negative breath test will consider referral for [facility]. Smoking cessation  Follow up in GI clinic in 6 weeks.

## 2020-10-19 LAB — SURGICAL PATHOLOGY

## 2020-10-24 ENCOUNTER — Telehealth (INDEPENDENT_AMBULATORY_CARE_PROVIDER_SITE_OTHER): Payer: Self-pay

## 2020-10-24 NOTE — Telephone Encounter (Signed)
Below surgical path note that Dr.Rehman spoke with Patient on 10/24/2020.

## 2020-10-24 NOTE — Telephone Encounter (Signed)
I called and left a message for the patient to return call. 

## 2020-10-24 NOTE — Telephone Encounter (Signed)
Per Dr Karilyn Cota result note from 10/24/2020 patient states she cannot come off PPI for 2 weeks in order to proceed with breast test.

## 2020-10-25 ENCOUNTER — Encounter (HOSPITAL_COMMUNITY): Payer: Self-pay | Admitting: Gastroenterology

## 2020-12-08 ENCOUNTER — Encounter (INDEPENDENT_AMBULATORY_CARE_PROVIDER_SITE_OTHER): Payer: Self-pay

## 2020-12-08 ENCOUNTER — Ambulatory Visit (INDEPENDENT_AMBULATORY_CARE_PROVIDER_SITE_OTHER): Payer: Medicaid Other | Admitting: Gastroenterology

## 2020-12-15 ENCOUNTER — Ambulatory Visit (INDEPENDENT_AMBULATORY_CARE_PROVIDER_SITE_OTHER): Payer: Medicaid Other | Admitting: Gastroenterology

## 2020-12-15 ENCOUNTER — Encounter (INDEPENDENT_AMBULATORY_CARE_PROVIDER_SITE_OTHER): Payer: Self-pay | Admitting: Gastroenterology

## 2020-12-15 ENCOUNTER — Encounter (INDEPENDENT_AMBULATORY_CARE_PROVIDER_SITE_OTHER): Payer: Self-pay

## 2020-12-25 ENCOUNTER — Encounter (HOSPITAL_COMMUNITY): Payer: Self-pay

## 2020-12-25 ENCOUNTER — Other Ambulatory Visit: Payer: Self-pay

## 2020-12-25 ENCOUNTER — Emergency Department (HOSPITAL_COMMUNITY): Payer: Medicaid Other

## 2020-12-25 ENCOUNTER — Emergency Department (HOSPITAL_COMMUNITY)
Admission: EM | Admit: 2020-12-25 | Discharge: 2020-12-25 | Disposition: A | Discharge status: Home / Self Care | Payer: Medicaid Other | Attending: Emergency Medicine | Admitting: Emergency Medicine

## 2020-12-25 DIAGNOSIS — Y92410 Unspecified street and highway as the place of occurrence of the external cause: Secondary | ICD-10-CM | POA: Diagnosis not present

## 2020-12-25 DIAGNOSIS — S3991XA Unspecified injury of abdomen, initial encounter: Secondary | ICD-10-CM | POA: Insufficient documentation

## 2020-12-25 DIAGNOSIS — F17210 Nicotine dependence, cigarettes, uncomplicated: Secondary | ICD-10-CM | POA: Diagnosis not present

## 2020-12-25 DIAGNOSIS — J45909 Unspecified asthma, uncomplicated: Secondary | ICD-10-CM | POA: Insufficient documentation

## 2020-12-25 DIAGNOSIS — Z20822 Contact with and (suspected) exposure to covid-19: Secondary | ICD-10-CM | POA: Insufficient documentation

## 2020-12-25 DIAGNOSIS — S299XXA Unspecified injury of thorax, initial encounter: Secondary | ICD-10-CM | POA: Diagnosis not present

## 2020-12-25 DIAGNOSIS — M545 Low back pain, unspecified: Secondary | ICD-10-CM | POA: Insufficient documentation

## 2020-12-25 DIAGNOSIS — M25532 Pain in left wrist: Secondary | ICD-10-CM | POA: Insufficient documentation

## 2020-12-25 DIAGNOSIS — S0990XA Unspecified injury of head, initial encounter: Secondary | ICD-10-CM | POA: Insufficient documentation

## 2020-12-25 LAB — COMPREHENSIVE METABOLIC PANEL
ALT: 18 U/L (ref 0–44)
AST: 25 U/L (ref 15–41)
Albumin: 3.7 g/dL (ref 3.5–5.0)
Alkaline Phosphatase: 28 U/L — ABNORMAL LOW (ref 38–126)
Anion gap: 7 (ref 5–15)
BUN: 7 mg/dL (ref 6–20)
CO2: 24 mmol/L (ref 22–32)
Calcium: 9.2 mg/dL (ref 8.9–10.3)
Chloride: 106 mmol/L (ref 98–111)
Creatinine, Ser: 0.55 mg/dL (ref 0.44–1.00)
GFR, Estimated: >60 mL/min (ref >60)
Glucose, Bld: 110 mg/dL — ABNORMAL HIGH (ref 70–99)
Potassium: 4 mmol/L (ref 3.5–5.1)
Sodium: 137 mmol/L (ref 135–145)
Total Bilirubin: 0.4 mg/dL (ref 0.3–1.2)
Total Protein: 7.1 g/dL (ref 6.5–8.1)

## 2020-12-25 LAB — CBC
HCT: 47.2 % — ABNORMAL HIGH (ref 36.0–46.0)
Hemoglobin: 15.3 g/dL — ABNORMAL HIGH (ref 12.0–15.0)
MCH: 30.1 pg (ref 26.0–34.0)
MCHC: 32.4 g/dL (ref 30.0–36.0)
MCV: 92.9 fL (ref 80.0–100.0)
Platelets: 274 10*3/uL (ref 150–400)
RBC: 5.08 MIL/uL (ref 3.87–5.11)
RDW: 12.8 % (ref 11.5–15.5)
WBC: 13 10*3/uL — ABNORMAL HIGH (ref 4.0–10.5)
nRBC: 0 % (ref 0.0–0.2)

## 2020-12-25 LAB — RESP PANEL BY RT-PCR (FLU A&B, COVID) ARPGX2
Influenza A by PCR: NEGATIVE
Influenza B by PCR: NEGATIVE
SARS Coronavirus 2 by RT PCR: NEGATIVE

## 2020-12-25 MED ORDER — OXYCODONE HCL 5 MG PO TABS
5.0000 mg | ORAL_TABLET | Freq: Once | ORAL | Status: AC
  Administered 2020-12-25: 5 mg via ORAL
  Filled 2020-12-25: qty 1

## 2020-12-25 MED ORDER — FENTANYL CITRATE PF 50 MCG/ML IJ SOSY
50.0000 ug | PREFILLED_SYRINGE | INTRAMUSCULAR | Status: DC | PRN
  Administered 2020-12-25 (×2): 50 ug via INTRAVENOUS
  Filled 2020-12-25 (×2): qty 1

## 2020-12-25 MED ORDER — IOHEXOL 300 MG/ML  SOLN
100.0000 mL | Freq: Once | INTRAMUSCULAR | Status: AC | PRN
  Administered 2020-12-25: 100 mL via INTRAVENOUS
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
## 2020-12-25 NOTE — Discharge Instructions (Signed)
You will likely be more sore tomorrow than you are today.  Treat pain at home with ibuprofen and Tylenol.  Wear wrist brace, as needed, for comfort.
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
## 2021-02-07 ENCOUNTER — Encounter (INDEPENDENT_AMBULATORY_CARE_PROVIDER_SITE_OTHER): Payer: Self-pay | Admitting: *Deleted

## 2021-04-03 ENCOUNTER — Encounter (INDEPENDENT_AMBULATORY_CARE_PROVIDER_SITE_OTHER): Payer: Self-pay | Admitting: Gastroenterology

## 2021-04-03 ENCOUNTER — Ambulatory Visit (INDEPENDENT_AMBULATORY_CARE_PROVIDER_SITE_OTHER): Payer: Medicaid Other | Admitting: Gastroenterology

## 2021-04-03 ENCOUNTER — Other Ambulatory Visit: Payer: Self-pay

## 2021-04-03 VITALS — BP 101/72 | HR 78 | Temp 98.7°F | Ht 65.0 in | Wt 213.9 lb

## 2021-04-03 DIAGNOSIS — K259 Gastric ulcer, unspecified as acute or chronic, without hemorrhage or perforation: Secondary | ICD-10-CM | POA: Diagnosis not present

## 2021-04-03 DIAGNOSIS — Z800 Family history of malignant neoplasm of digestive organs: Secondary | ICD-10-CM

## 2021-04-03 DIAGNOSIS — A048 Other specified bacterial intestinal infections: Secondary | ICD-10-CM

## 2021-04-03 MED ORDER — METRONIDAZOLE 500 MG PO TABS: 500.0000 mg | ORAL_TABLET | Freq: Three times a day (TID) | ORAL | 0 refills | Status: AC

## 2021-04-03 MED ORDER — BISMUTH 262 MG PO CHEW: 2.0000 | CHEWABLE_TABLET | Freq: Four times a day (QID) | ORAL | 0 refills | Status: AC

## 2021-04-03 MED ORDER — TETRACYCLINE HCL 500 MG PO CAPS: 500.0000 mg | ORAL_CAPSULE | Freq: Four times a day (QID) | ORAL | 0 refills | Status: AC

## 2021-04-03 NOTE — Progress Notes (Signed)
Angela Kent, M.D. Gastroenterology & Hepatology [facility] [address] [place], [place] [address]  Primary Care Physician: Allwardt, Crist Infante, PA-C [id number] [place] [address]  I will communicate my assessment and recommendations to the referring MD via EMR.  Problems: Recurrent gastric ulcers Chronic right upper quadrant abdominal pain Family Hx rectal cancer History of H. Pylori. 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 female with PMH GERD, ashtma, depression and previous H. pylori, who presents for follow up of right upper quadrant abdominal pain and recurrent ulcers.  She has been seen for the last year and a half and has undergone a total of 4 esophagogastroduodenospy's showing recurrent ulcers despite changing her medication regimen.  Multiple biopsies have been taken from the stomach and they have been negative for H. pylori while on PPI.  She has been counseled about smoking sensation and NSAID avoidance.States she quit smoking in November 2022 and has been taking Tylenol for pain.  Notably, she was treated for H. pylori in 2020 with amoxicillin and clarithromycin based triple regimen.  She was never formally tested for cure as she has not been able to tolerate stopping the PPI to have the breath or stool test.  Patient reports she has presented recurrent episodes of RUQ pain, which has not improved while on Prevacid 60 mg twice a day. She reports that the pain is very severe, especially after she has eaten. She also reports that she has 1-2 watery bowel movements per day - states that it started after she had her cholecystectomy when she was 36 years old. Sometimes she can have some episodes of fecal soiling. The patient denies having any nausea, vomiting, fever, chills, hematochezia, melena, hematemesis, abdominal distention, jaundice, pruritus or weight  loss.  Father was diagnosed with rectal cancer at age 35.  Last EGD: 10/18/2020 The examined esophagus was normal. Findings: Three non-obstructing non-bleeding superficial gastric ulcers with a clean ulcer base (Forrest Class III) were found in the gastric antrum. The largest lesion was 5 mm in largest dimension. There were also some associated erosions in the gastric body without presence of active bleeding or stigmata of recent bleeding. Biopsies from body and antrum were taken with a cold forceps for Helicobacter pylori testing. The examined duodenum was normal.  Path: A. STOMACH, BIOPSY:  - Gastric mucosa with focal erosion, hyperemia and reactive changes.  - Warthin-Starry negative for Helicobacter pylori.  - No intestinal metaplasia, dysplasia or carcinoma.   Last Colonoscopy: never  Past Medical History: Past Medical History:  Diagnosis Date   Acid reflux    Anxiety    Asthma    Depression    Stomach ulcer     Past Surgical History: Past Surgical History:  Procedure Laterality Date   APPENDECTOMY     BIOPSY  09/25/2018   Procedure: BIOPSY;  Surgeon: Malissa Hippo, MD;  [facility];  Service: Endoscopy;;   BIOPSY  02/23/2020   Procedure: BIOPSY;  Surgeon: Dolores Frame, MD;  [facility];  Service: Gastroenterology;;   BIOPSY  05/24/2020   Procedure: BIOPSY;  Surgeon: Dolores Frame, MD;  [facility];  Service: Gastroenterology;;   BIOPSY  08/26/2020   Procedure: BIOPSY;  Surgeon: Dolores Frame, MD;  [facility];  Service: Gastroenterology;;   BIOPSY  10/18/2020   Procedure: BIOPSY;  Surgeon: Marguerita Merles,  Reuel Boom, MD;  [facility];  Service: Gastroenterology;;   CESAREAN SECTION     CHOLECYSTECTOMY     ESOPHAGEAL DILATION N/A 02/23/2020   Procedure: ESOPHAGEAL DILATION;  Surgeon: Dolores Frame, MD;  [facility];  Service: Gastroenterology;   Laterality: N/A;   ESOPHAGOGASTRODUODENOSCOPY N/A 09/25/2018   Procedure: ESOPHAGOGASTRODUODENOSCOPY (EGD);  Surgeon: Malissa Hippo, MD;  [facility];  Service: Endoscopy;  Laterality: N/A;  1200   ESOPHAGOGASTRODUODENOSCOPY (EGD) WITH PROPOFOL N/A 02/23/2020   Procedure: ESOPHAGOGASTRODUODENOSCOPY (EGD) WITH PROPOFOL;  Surgeon: Dolores Frame, MD;  [facility];  Service: Gastroenterology;  Laterality: N/A;  9:30   ESOPHAGOGASTRODUODENOSCOPY (EGD) WITH PROPOFOL N/A 05/24/2020   Procedure: ESOPHAGOGASTRODUODENOSCOPY (EGD) WITH PROPOFOL;  Surgeon: Dolores Frame, MD;  [facility];  Service: Gastroenterology;  Laterality: N/A;  AM   ESOPHAGOGASTRODUODENOSCOPY (EGD) WITH PROPOFOL N/A 08/26/2020   Procedure: ESOPHAGOGASTRODUODENOSCOPY (EGD) WITH PROPOFOL;  Surgeon: Dolores Frame, MD;  [facility];  Service: Gastroenterology;  Laterality: N/A;  1:45   ESOPHAGOGASTRODUODENOSCOPY (EGD) WITH PROPOFOL N/A 10/18/2020   Procedure: ESOPHAGOGASTRODUODENOSCOPY (EGD) WITH PROPOFOL;  Surgeon: Dolores Frame, MD;  [facility];  Service: Gastroenterology;  Laterality: N/A;  12:00   PARTIAL HYSTERECTOMY     TONSILLECTOMY      Family History:History reviewed. No pertinent family history.  Social History: Social History   Tobacco Use  Smoking Status Former   Packs/day: 0.50   Types: Cigarettes   Quit date: 12/2020   Years since quitting: 0.3  Smokeless Tobacco Never   Social History   Substance and Sexual Activity  Alcohol Use Never   Social History   Substance and Sexual Activity  Drug Use Never    Allergies: Allergies  Allergen Reactions   Codeine Hives    Medications: Current Outpatient Medications  Medication Sig Dispense Refill   acetaminophen (TYLENOL) 325 MG tablet Take 650 mg by mouth every 6 (six) hours as needed for moderate pain or headache.     busPIRone (BUSPAR) 10 MG tablet  Take 10 mg by mouth 2 (two) times daily as needed for anxiety.     cetirizine (ZYRTEC) 10 MG tablet Take 10 mg by mouth daily.     dicyclomine (BENTYL) 10 MG capsule Take 1 capsule (10 mg total) by mouth 3 (three) times daily as needed (abd pain). 60 capsule 1   escitalopram (LEXAPRO) 20 MG tablet Take 20 mg by mouth daily.     gabapentin (NEURONTIN) 300 MG capsule Take 300 mg by mouth at bedtime as needed (pain).     hydrOXYzine (VISTARIL) 25 MG capsule Take 25 mg by mouth 2 (two) times daily as needed for anxiety.     lansoprazole (PREVACID) 30 MG capsule Take 2 capsules (60 mg total) by mouth 2 (two) times daily before a meal. 120 capsule 5   sucralfate (CARAFATE) 1 g tablet Take 1 tablet (1 g total) by mouth 4 (four) times daily. 120 tablet 0   traZODone (DESYREL) 100 MG tablet Take 50-100 mg by mouth at bedtime. (Patient not taking: Reported on 04/03/2021)     No current facility-administered medications for this visit.    Review of Systems: GENERAL: negative for malaise, night sweats HEENT: No changes in hearing or vision, no nose bleeds or other nasal problems. NECK: Negative for lumps, goiter, pain and significant neck swelling RESPIRATORY: Negative for cough, wheezing CARDIOVASCULAR: Negative for chest pain, leg swelling, palpitations, orthopnea GI: SEE HPI  MUSCULOSKELETAL: Negative for joint pain or swelling, back pain, and muscle pain. SKIN: Negative for lesions, rash PSYCH: Negative for sleep disturbance, mood disorder and recent psychosocial stressors. HEMATOLOGY Negative for prolonged bleeding, bruising easily, and swollen nodes. ENDOCRINE: Negative for cold or heat intolerance, polyuria, polydipsia and goiter. NEURO: negative for tremor, gait imbalance, syncope and seizures. The remainder of the review of systems is noncontributory.   Physical Exam: BP 101/72 (BP Location: Right Arm, Patient Position: Sitting, Cuff Size: Large)    Pulse 78    Temp 98.7 F (37.1 C) (Oral)     Ht 5\' 5"  (1.651 m)    Wt 213 lb 14.4 oz (97 kg)    BMI 35.59 kg/m  GENERAL: The patient is AO x3, in no acute distress. HEENT: Head is normocephalic and atraumatic. EOMI are intact. Mouth is well hydrated and without lesions. NECK: Supple. No masses LUNGS: Clear to auscultation. No presence of rhonchi/wheezing/rales. Adequate chest expansion HEART: RRR, normal s1 and s2. ABDOMEN: tender upon palpation of the epigastric and right upper quadrant area, no guarding, no peritoneal signs, and nondistended. BS +. No masses. EXTREMITIES: Without any cyanosis, clubbing, rash, lesions or edema. NEUROLOGIC: AOx3, no focal motor deficit. SKIN: no jaundice, no rashes  Imaging/Labs: as above  I personally reviewed and interpreted the available labs, imaging and endoscopic files.  Impression and Plan: Angela Kent is a 36 y.o. female with PMH GERD, ashtma, depression and previous H. pylori, who presents for follow up of right upper quadrant abdominal pain and recurrent ulcers.  The patient has presented recurrent ulcers despite stopping the use of NSAIDs and recently quitting cigarette smoking.  She had a previous history of H. pylori which was treated with clarithromycin-based regimen and its eradication was never confirmed.  She has presented recurrent pain in her right upper quadrant which could be related to peptic ulcer disease.  At this point, I had a thorough discussion with the patient regarding the possibility of empirically treating respiratory H. pylori with a quadruple bismuth regimen as she has not been able to tolerate PPI cessation to assess eradication.  Patient agreed to proceed with this.  She should continue avoiding NSAIDs or high-dose aspirin, as well as continue with smoking cessation.  If after she receives treatment she remains symptomatic, we may evaluate her symptoms further with a repeat EGD and a possible CT angio of the abdomen and pelvis, before considering referring her for  antrectomy.  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  She would like to defer management of this until her abdominal complaints have improved.  Finally, her father was diagnosed with rectal cancer in his 75s, she should start colorectal cancer screening at age 76.  -Continue Prevacid 60 mg twice a day. -Start bismuth quadruple therapy for H. Pylori -Will discuss possible management with colestipol for diarrhea in next appointment -Continue smoking cessation -Screening colonoscopy at age 29  All questions were answered.      43, MD Gastroenterology and Hepatology [facility]

## 2021-04-03 NOTE — Patient Instructions (Signed)
Continue Prevacid 60 mg twice a day. Start bismuth quadruple therapy for H. Pylori Will discuss possible management with colestipol for diarrhea in next appointment Continue smoking cessation

## 2021-04-04 ENCOUNTER — Other Ambulatory Visit (INDEPENDENT_AMBULATORY_CARE_PROVIDER_SITE_OTHER): Payer: Self-pay | Admitting: *Deleted

## 2021-04-04 ENCOUNTER — Telehealth (INDEPENDENT_AMBULATORY_CARE_PROVIDER_SITE_OTHER): Payer: Self-pay | Admitting: *Deleted

## 2021-04-04 MED ORDER — PYLERA 140-125-125 MG PO CAPS: ORAL_CAPSULE | ORAL | 0 refills | Status: DC

## 2021-04-04 NOTE — Telephone Encounter (Signed)
Eddie from [facility]. [phone]  Flagyl is covered by insurance but tetracycline and bismuth is not. Tetracycline is between 500 - 600 per pharmacist. He states he can send paper to do PA but he does not think it will help.

## 2021-04-04 NOTE — Telephone Encounter (Signed)
Pharmacist at [facility] states pylera is covered. Needs rx sent to [facility]. Do you want pylera 140-125-125  3 QID for 2 weeks?

## 2021-04-04 NOTE — Telephone Encounter (Signed)
Yes, please. Also tell them to cancel the metronidazole, tetracycline and bismuth I sent before. Thanks

## 2021-04-04 NOTE — Telephone Encounter (Signed)
Med sent to pharm. Pt was notified.

## 2021-04-04 NOTE — Telephone Encounter (Signed)
Can we ask them if Pylera for 2 weeks will be covered?

## 2021-04-05 ENCOUNTER — Other Ambulatory Visit (INDEPENDENT_AMBULATORY_CARE_PROVIDER_SITE_OTHER): Payer: Self-pay | Admitting: *Deleted

## 2021-04-05 ENCOUNTER — Telehealth (INDEPENDENT_AMBULATORY_CARE_PROVIDER_SITE_OTHER): Payer: Self-pay | Admitting: *Deleted

## 2021-04-05 MED ORDER — PYLERA 140-125-125 MG PO CAPS: ORAL_CAPSULE | ORAL | 1 refills | Status: DC

## 2021-04-05 NOTE — Telephone Encounter (Signed)
Yes please

## 2021-04-05 NOTE — Telephone Encounter (Signed)
[facility] called. The pylera comes in a box of #120 so a 10 day supply. You wanted her to take 14 day supply so she will need #120 with one refill and I can tell pt to take only for 14 days and discard rest of med. Are you ok with this?

## 2021-04-05 NOTE — Telephone Encounter (Signed)
Sent to pharm and pt notified she will get 20 day supply and to take for 14 days and discard rest of med.

## 2021-04-12 ENCOUNTER — Other Ambulatory Visit: Payer: Self-pay | Admitting: Orthopedic Surgery

## 2021-04-18 ENCOUNTER — Encounter (HOSPITAL_BASED_OUTPATIENT_CLINIC_OR_DEPARTMENT_OTHER): Payer: Self-pay | Admitting: Orthopedic Surgery

## 2021-04-18 ENCOUNTER — Other Ambulatory Visit: Payer: Self-pay

## 2021-04-26 NOTE — Anesthesia Preprocedure Evaluation (Addendum)
Anesthesia Evaluation  ?Patient identified by MRN, date of birth, ID band ?Patient awake ? ? ? ?Reviewed: ?Allergy & Precautions, NPO status , Patient's Chart, lab work & pertinent test results ? ?Airway ?Mallampati: II ? ?TM Distance: >3 FB ?Neck ROM: Full ? ? ? Dental ?no notable dental hx. ? ?  ?Pulmonary ?asthma , former smoker,  ?  ?Pulmonary exam normal ? ? ? ? ? ? ? Cardiovascular ?negative cardio ROS ?Normal cardiovascular exam ? ? ?  ?Neuro/Psych ?PSYCHIATRIC DISORDERS Anxiety Depression negative neurological ROS ?   ? GI/Hepatic ?Neg liver ROS, PUD, GERD  Medicated and Controlled,  ?Endo/Other  ?negative endocrine ROS ? Renal/GU ?negative Renal ROS  ? ?  ?Musculoskeletal ?negative musculoskeletal ROS ?(+)  ? Abdominal ?(+) + obese,   ?Peds ? Hematology ?negative hematology ROS ?(+)   ?Anesthesia Other Findings ?LEFT SCAPHOID FRACTURE ? Reproductive/Obstetrics ?S/p hysterectomy ? ?  ? ? ? ? ? ? ? ? ? ? ? ? ? ?  ?  ? ? ? ? ? ? ? ?Anesthesia Physical ?Anesthesia Plan ? ?ASA: 2 ? ?Anesthesia Plan: General and Regional  ? ?Post-op Pain Management:   ? ?Induction: Intravenous ? ?PONV Risk Score and Plan: 3 and Ondansetron, Dexamethasone, Midazolam and Treatment may vary due to age or medical condition ? ?Airway Management Planned: LMA ? ?Additional Equipment:  ? ?Intra-op Plan:  ? ?Post-operative Plan: Extubation in OR ? ?Informed Consent: I have reviewed the patients History and Physical, chart, labs and discussed the procedure including the risks, benefits and alternatives for the proposed anesthesia with the patient or authorized representative who has indicated his/her understanding and acceptance.  ? ? ? ?Dental advisory given ? ?Plan Discussed with: CRNA and Surgeon ? ?Anesthesia Plan Comments:   ? ? ? ? ?Anesthesia Quick Evaluation ? ?

## 2021-04-27 ENCOUNTER — Ambulatory Visit (HOSPITAL_BASED_OUTPATIENT_CLINIC_OR_DEPARTMENT_OTHER): Payer: Medicaid Other

## 2021-04-27 ENCOUNTER — Ambulatory Visit (HOSPITAL_BASED_OUTPATIENT_CLINIC_OR_DEPARTMENT_OTHER): Payer: Medicaid Other | Admitting: Anesthesiology

## 2021-04-27 ENCOUNTER — Other Ambulatory Visit: Payer: Self-pay

## 2021-04-27 ENCOUNTER — Ambulatory Visit (HOSPITAL_BASED_OUTPATIENT_CLINIC_OR_DEPARTMENT_OTHER)
Admission: RE | Admit: 2021-04-27 | Discharge: 2021-04-27 | Disposition: A | Discharge status: Home / Self Care | Payer: Medicaid Other | Attending: Orthopedic Surgery | Admitting: Orthopedic Surgery

## 2021-04-27 ENCOUNTER — Encounter (HOSPITAL_BASED_OUTPATIENT_CLINIC_OR_DEPARTMENT_OTHER): Payer: Self-pay | Admitting: Orthopedic Surgery

## 2021-04-27 ENCOUNTER — Encounter (HOSPITAL_BASED_OUTPATIENT_CLINIC_OR_DEPARTMENT_OTHER)
Admission: RE | Disposition: A | Discharge status: Home / Self Care | Payer: Self-pay | Source: Home / Self Care | Attending: Orthopedic Surgery

## 2021-04-27 DIAGNOSIS — F419 Anxiety disorder, unspecified: Secondary | ICD-10-CM | POA: Diagnosis not present

## 2021-04-27 DIAGNOSIS — J45909 Unspecified asthma, uncomplicated: Secondary | ICD-10-CM | POA: Insufficient documentation

## 2021-04-27 DIAGNOSIS — Z79899 Other long term (current) drug therapy: Secondary | ICD-10-CM | POA: Diagnosis not present

## 2021-04-27 DIAGNOSIS — K219 Gastro-esophageal reflux disease without esophagitis: Secondary | ICD-10-CM | POA: Insufficient documentation

## 2021-04-27 DIAGNOSIS — X58XXXD Exposure to other specified factors, subsequent encounter: Secondary | ICD-10-CM | POA: Diagnosis not present

## 2021-04-27 DIAGNOSIS — F32A Depression, unspecified: Secondary | ICD-10-CM | POA: Insufficient documentation

## 2021-04-27 DIAGNOSIS — Z87891 Personal history of nicotine dependence: Secondary | ICD-10-CM | POA: Insufficient documentation

## 2021-04-27 DIAGNOSIS — S62002K Unspecified fracture of navicular [scaphoid] bone of left wrist, subsequent encounter for fracture with nonunion: Secondary | ICD-10-CM

## 2021-04-27 HISTORY — PX: OPEN REDUCTION INTERNAL FIXATION (ORIF) SCAPHOID WITH DISTAL RADIUS GRAFT: SHX5667

## 2021-04-27 SURGERY — OPEN REDUCTION INTERNAL FIXATION (ORIF) SCAPHOID WITH DISTAL RADIUS GRAFT
Anesthesia: General | Site: Wrist | Laterality: Left

## 2021-04-27 MED ORDER — PROPOFOL 10 MG/ML IV BOLUS
INTRAVENOUS | Status: AC
  Filled 2021-04-27: qty 20

## 2021-04-27 MED ORDER — PHENYLEPHRINE HCL (PRESSORS) 10 MG/ML IV SOLN
INTRAVENOUS | Status: DC | PRN
Start: 2021-04-27 — End: 2021-04-27
  Administered 2021-04-27 (×4): 80 ug via INTRAVENOUS

## 2021-04-27 MED ORDER — FENTANYL CITRATE (PF) 100 MCG/2ML IJ SOLN
100.0000 ug | Freq: Once | INTRAMUSCULAR | Status: AC
  Administered 2021-04-27: 100 ug via INTRAVENOUS

## 2021-04-27 MED ORDER — ACETAMINOPHEN 500 MG PO TABS
ORAL_TABLET | ORAL | Status: AC
  Filled 2021-04-27: qty 2

## 2021-04-27 MED ORDER — PROPOFOL 10 MG/ML IV BOLUS
INTRAVENOUS | Status: DC | PRN
  Administered 2021-04-27: 200 mg via INTRAVENOUS

## 2021-04-27 MED ORDER — LIDOCAINE 2% (20 MG/ML) 5 ML SYRINGE
INTRAMUSCULAR | Status: AC
  Filled 2021-04-27: qty 5

## 2021-04-27 MED ORDER — LIDOCAINE 2% (20 MG/ML) 5 ML SYRINGE
INTRAMUSCULAR | Status: DC | PRN
  Administered 2021-04-27: 40 mg via INTRAVENOUS

## 2021-04-27 MED ORDER — MIDAZOLAM HCL 2 MG/2ML IJ SOLN: 2.0000 mg | Freq: Once | INTRAMUSCULAR | Status: DC

## 2021-04-27 MED ORDER — PHENYLEPHRINE 40 MCG/ML (10ML) SYRINGE FOR IV PUSH (FOR BLOOD PRESSURE SUPPORT)
PREFILLED_SYRINGE | INTRAVENOUS | Status: AC
  Filled 2021-04-27: qty 10

## 2021-04-27 MED ORDER — FENTANYL CITRATE (PF) 100 MCG/2ML IJ SOLN
INTRAMUSCULAR | Status: AC
  Filled 2021-04-27: qty 2

## 2021-04-27 MED ORDER — OXYCODONE-ACETAMINOPHEN 5-325 MG PO TABS: ORAL_TABLET | ORAL | 0 refills | Status: DC

## 2021-04-27 MED ORDER — LACTATED RINGERS IV SOLN: INTRAVENOUS | Status: DC

## 2021-04-27 MED ORDER — ONDANSETRON HCL 4 MG/2ML IJ SOLN
INTRAMUSCULAR | Status: AC
  Filled 2021-04-27: qty 2

## 2021-04-27 MED ORDER — DEXAMETHASONE SODIUM PHOSPHATE 10 MG/ML IJ SOLN
INTRAMUSCULAR | Status: AC
  Filled 2021-04-27: qty 1

## 2021-04-27 MED ORDER — ONDANSETRON HCL 4 MG/2ML IJ SOLN
INTRAMUSCULAR | Status: DC | PRN
  Administered 2021-04-27: 4 mg via INTRAVENOUS

## 2021-04-27 MED ORDER — MIDAZOLAM HCL 2 MG/2ML IJ SOLN
INTRAMUSCULAR | Status: AC
  Filled 2021-04-27: qty 2

## 2021-04-27 MED ORDER — KETOROLAC TROMETHAMINE 30 MG/ML IJ SOLN: 30.0000 mg | Freq: Once | INTRAMUSCULAR | Status: DC | PRN

## 2021-04-27 MED ORDER — PROPOFOL 500 MG/50ML IV EMUL
INTRAVENOUS | Status: AC
  Filled 2021-04-27: qty 50

## 2021-04-27 MED ORDER — ACETAMINOPHEN 500 MG PO TABS
1000.0000 mg | ORAL_TABLET | Freq: Once | ORAL | Status: AC
  Administered 2021-04-27: 1000 mg via ORAL

## 2021-04-27 MED ORDER — DEXAMETHASONE SODIUM PHOSPHATE 4 MG/ML IJ SOLN
INTRAMUSCULAR | Status: DC | PRN
  Administered 2021-04-27: 10 mg via INTRAVENOUS

## 2021-04-27 MED ORDER — FENTANYL CITRATE (PF) 100 MCG/2ML IJ SOLN: 100.0000 ug | Freq: Once | INTRAMUSCULAR | Status: DC

## 2021-04-27 MED ORDER — MIDAZOLAM HCL 5 MG/5ML IJ SOLN
INTRAMUSCULAR | Status: DC | PRN
  Administered 2021-04-27: 2 mg via INTRAVENOUS

## 2021-04-27 MED ORDER — CEFAZOLIN SODIUM-DEXTROSE 2-4 GM/100ML-% IV SOLN
INTRAVENOUS | Status: AC
  Filled 2021-04-27: qty 100

## 2021-04-27 MED ORDER — DEXMEDETOMIDINE (PRECEDEX) IN NS 20 MCG/5ML (4 MCG/ML) IV SYRINGE
PREFILLED_SYRINGE | INTRAVENOUS | Status: AC
  Filled 2021-04-27: qty 5

## 2021-04-27 MED ORDER — ROPIVACAINE HCL 5 MG/ML IJ SOLN
INTRAMUSCULAR | Status: DC | PRN
  Administered 2021-04-27: 30 mL via PERINEURAL

## 2021-04-27 MED ORDER — DEXMEDETOMIDINE (PRECEDEX) IN NS 20 MCG/5ML (4 MCG/ML) IV SYRINGE
PREFILLED_SYRINGE | INTRAVENOUS | Status: DC | PRN
  Administered 2021-04-27: 8 ug via INTRAVENOUS

## 2021-04-27 MED ORDER — FENTANYL CITRATE (PF) 100 MCG/2ML IJ SOLN
INTRAMUSCULAR | Status: AC
Start: 2021-04-27 — End: ?
  Filled 2021-04-27: qty 2

## 2021-04-27 MED ORDER — 0.9 % SODIUM CHLORIDE (POUR BTL) OPTIME
TOPICAL | Status: DC | PRN
  Administered 2021-04-27: 70 mL

## 2021-04-27 MED ORDER — FENTANYL CITRATE (PF) 100 MCG/2ML IJ SOLN: 25.0000 ug | INTRAMUSCULAR | Status: DC | PRN

## 2021-04-27 MED ORDER — MIDAZOLAM HCL 2 MG/2ML IJ SOLN
2.0000 mg | Freq: Once | INTRAMUSCULAR | Status: AC
Start: 2021-04-27 — End: 2021-04-27
  Administered 2021-04-27: 2 mg via INTRAVENOUS

## 2021-04-27 MED ORDER — CEFAZOLIN SODIUM-DEXTROSE 2-4 GM/100ML-% IV SOLN
2.0000 g | INTRAVENOUS | Status: AC
  Administered 2021-04-27: 2 g via INTRAVENOUS

## 2021-04-27 MED ORDER — AMISULPRIDE (ANTIEMETIC) 5 MG/2ML IV SOLN: 10.0000 mg | Freq: Once | INTRAVENOUS | Status: DC | PRN

## 2021-04-27 SURGICAL SUPPLY — 58 items
APL PRP STRL LF DISP 70% ISPRP (MISCELLANEOUS) ×1
BIT DRILL ACUTRAK 2 MINI (BIT) IMPLANT
BLADE MINI RND TIP GREEN BEAV (BLADE) ×1 IMPLANT
BLADE SURG 15 STRL LF DISP TIS (BLADE) ×2 IMPLANT
BLADE SURG 15 STRL SS (BLADE) ×4
BNDG CMPR 9X4 STRL LF SNTH (GAUZE/BANDAGES/DRESSINGS) ×1
BNDG CONFORM 3 STRL LF (GAUZE/BANDAGES/DRESSINGS) IMPLANT
BNDG ELASTIC 2X5.8 VLCR STR LF (GAUZE/BANDAGES/DRESSINGS) ×2 IMPLANT
BNDG ELASTIC 3X5.8 VLCR STR LF (GAUZE/BANDAGES/DRESSINGS) ×2 IMPLANT
BNDG ESMARK 4X9 LF (GAUZE/BANDAGES/DRESSINGS) ×2 IMPLANT
BNDG GAUZE ELAST 4 BULKY (GAUZE/BANDAGES/DRESSINGS) ×2 IMPLANT
CHLORAPREP W/TINT 26 (MISCELLANEOUS) ×2 IMPLANT
CORD BIPOLAR FORCEPS 12FT (ELECTRODE) ×2 IMPLANT
COVER BACK TABLE 60X90IN (DRAPES) ×2 IMPLANT
COVER MAYO STAND STRL (DRAPES) ×2 IMPLANT
DRAPE EXTREMITY T 121X128X90 (DISPOSABLE) ×2 IMPLANT
DRAPE OEC MINIVIEW 54X84 (DRAPES) ×2 IMPLANT
DRAPE SURG 17X23 STRL (DRAPES) ×2 IMPLANT
DRILL ACUTRAK 2 MINI (BIT) ×2
GAUZE SPONGE 4X4 12PLY STRL (GAUZE/BANDAGES/DRESSINGS) ×2 IMPLANT
GAUZE XEROFORM 1X8 LF (GAUZE/BANDAGES/DRESSINGS) ×2 IMPLANT
GLOVE SRG 8 PF TXTR STRL LF DI (GLOVE) ×1 IMPLANT
GLOVE SURG ENC MOIS LTX SZ7.5 (GLOVE) ×4 IMPLANT
GLOVE SURG UNDER POLY LF SZ8 (GLOVE) ×2
GOWN STRL REUS W/ TWL LRG LVL3 (GOWN DISPOSABLE) ×1 IMPLANT
GOWN STRL REUS W/TWL LRG LVL3 (GOWN DISPOSABLE) ×2
GUIDEWIRE ORTHO MINI ACTK .045 (WIRE) ×1 IMPLANT
IV CATH 14GX2 1/4 (CATHETERS) ×1 IMPLANT
NDL HYPO 25X1 1.5 SAFETY (NEEDLE) IMPLANT
NEEDLE HYPO 22GX1.5 SAFETY (NEEDLE) IMPLANT
NEEDLE HYPO 25X1 1.5 SAFETY (NEEDLE) ×2 IMPLANT
NS IRRIG 1000ML POUR BTL (IV SOLUTION) ×2 IMPLANT
PACK BASIN DAY SURGERY FS (CUSTOM PROCEDURE TRAY) ×2 IMPLANT
PAD CAST 3X4 CTTN HI CHSV (CAST SUPPLIES) ×1 IMPLANT
PAD CAST 4YDX4 CTTN HI CHSV (CAST SUPPLIES) IMPLANT
PADDING CAST ABS 4INX4YD NS (CAST SUPPLIES) ×1
PADDING CAST ABS COTTON 4X4 ST (CAST SUPPLIES) ×1 IMPLANT
PADDING CAST COTTON 3X4 STRL (CAST SUPPLIES) ×2
PADDING CAST COTTON 4X4 STRL (CAST SUPPLIES)
SCREW ACUTRAK 2 MINI 16MM (Screw) ×1 IMPLANT
SLEEVE SCD COMPRESS KNEE MED (STOCKING) IMPLANT
SLING ARM FOAM STRAP LRG (SOFTGOODS) ×1 IMPLANT
SPLINT PLASTER CAST XFAST 4X15 (CAST SUPPLIES) IMPLANT
SPLINT PLASTER XTRA FAST SET 4 (CAST SUPPLIES)
SPONGE SURGIFOAM ABS GEL 12-7 (HEMOSTASIS) ×1 IMPLANT
STOCKINETTE 4X48 STRL (DRAPES) ×2 IMPLANT
SUCTION FRAZIER HANDLE 10FR (MISCELLANEOUS)
SUCTION TUBE FRAZIER 10FR DISP (MISCELLANEOUS) IMPLANT
SUT ETHILON 3 0 PS 1 (SUTURE) IMPLANT
SUT ETHILON 4 0 PS 2 18 (SUTURE) IMPLANT
SUT VIC AB 3-0 PS1 18 (SUTURE)
SUT VIC AB 3-0 PS1 18XBRD (SUTURE) IMPLANT
SUT VICRYL 4-0 PS2 18IN ABS (SUTURE) ×2 IMPLANT
SYR BULB EAR ULCER 3OZ GRN STR (SYRINGE) ×2 IMPLANT
SYR CONTROL 10ML LL (SYRINGE) IMPLANT
TOWEL GREEN STERILE FF (TOWEL DISPOSABLE) ×4 IMPLANT
TUBE CONNECTING 20X1/4 (TUBING) IMPLANT
UNDERPAD 30X36 HEAVY ABSORB (UNDERPADS AND DIAPERS) ×2 IMPLANT

## 2021-04-27 NOTE — Progress Notes (Signed)
Assisted Dr. Ellender with left, ultrasound guided, supraclavicular block. Side rails up, monitors on throughout procedure. See vital signs in flow sheet. Tolerated Procedure well. 

## 2021-04-27 NOTE — Transfer of Care (Signed)
Immediate Anesthesia Transfer of Care Note ? ?Patient: MARCEY PERSAD ? ?Procedure(s) Performed: OPEN REDUCTION INTERNAL FIXATION (ORIF) LEFT SCAPHOID WITH BONE GRAFT (Left: Wrist) ? ?Patient Location: PACU ? ?Anesthesia Type:General and Regional ? ?Level of Consciousness: drowsy ? ?Airway & Oxygen Therapy: Patient Spontanous Breathing and Patient connected to face mask oxygen ? ?Post-op Assessment: Report given to RN and Post -op Vital signs reviewed and stable ? ?Post vital signs: Reviewed and stable ? ?Last Vitals:  ?Vitals Value Taken Time  ?BP 111/71 04/27/21 1048  ?Temp    ?Pulse 98 04/27/21 1049  ?Resp 19 04/27/21 1049  ?SpO2 96 % 04/27/21 1049  ?Vitals shown include unvalidated device data. ? ?Last Pain:  ?Vitals:  ? 04/27/21 0717  ?TempSrc: Oral  ?PainSc: 6   ?   ? ?Patients Stated Pain Goal: 3 (04/27/21 0717) ? ?Complications: No notable events documented. ?

## 2021-04-27 NOTE — Anesthesia Postprocedure Evaluation (Signed)
Anesthesia Post Note ? ?Patient: SHAKALA MARLATT ? ?Procedure(s) Performed: OPEN REDUCTION INTERNAL FIXATION (ORIF) LEFT SCAPHOID WITH BONE GRAFT (Left: Wrist) ? ?  ? ?Patient location during evaluation: PACU ?Anesthesia Type: General and Regional ?Level of consciousness: awake ?Pain management: pain level controlled ?Vital Signs Assessment: post-procedure vital signs reviewed and stable ?Respiratory status: spontaneous breathing, nonlabored ventilation, respiratory function stable and patient connected to nasal cannula oxygen ?Cardiovascular status: blood pressure returned to baseline and stable ?Postop Assessment: no apparent nausea or vomiting ?Anesthetic complications: no ? ? ?No notable events documented. ? ?Last Vitals:  ?Vitals:  ? 04/27/21 1130 04/27/21 1155  ?BP: 97/69 102/76  ?Pulse: 86 83  ?Resp: 11 16  ?Temp:  36.8 ?C  ?SpO2: 92% 92%  ?  ?Last Pain:  ?Vitals:  ? 04/27/21 1155  ?TempSrc:   ?PainSc: 0-No pain  ? ? ?  ?  ?  ?  ?  ?  ? ?Malia Corsi P Rhyanna Sorce ? ? ? ? ?

## 2021-04-27 NOTE — Op Note (Signed)
I assisted Surgeon(s) and Role: ?   Betha Loa, MD - Primary ?   Cindee Salt, MD - Assisting on the Procedure(s): ?OPEN REDUCTION INTERNAL FIXATION (ORIF) LEFT SCAPHOID WITH BONE GRAFT on 04/27/2021.  I provided assistance on this case as follows: Set up, approach to the scaphoid,fracture, protection of the radial artery, identification of the nonunion, debridement of the nonunion site, approach sedation and harvesting of the bone graft of the distal radius, smooth the bone graft, placement of the screw, closure of the wound with application of the dressing and splints. ? ?Electronically signed by: Cindee Salt, MD ?Date: 04/27/2021 Time: 10:50 AM  ?

## 2021-04-27 NOTE — H&P (Signed)
Angela Kent is an 36 y.o. female.   ?Chief Complaint: scaphoid fracture ?HPI: 36 yo female with left scaphoid fracture.  Has not healed despite splinting and bone stimulator.  She wishes to proceed with operative fixation. ? ?Allergies:  ?Allergies  ?Allergen Reactions  ? Codeine Hives  ? ? ?Past Medical History:  ?Diagnosis Date  ? Acid reflux   ? Anxiety   ? Asthma   ? Depression   ? Stomach ulcer   ? ? ?Past Surgical History:  ?Procedure Laterality Date  ? APPENDECTOMY    ? BIOPSY  09/25/2018  ? Procedure: BIOPSY;  Surgeon: Malissa Hippo, MD;  [facility];  Service: Endoscopy;;  ? BIOPSY  02/23/2020  ? Procedure: BIOPSY;  Surgeon: Dolores Frame, MD;  [facility];  Service: Gastroenterology;;  ? BIOPSY  05/24/2020  ? Procedure: BIOPSY;  Surgeon: Dolores Frame, MD;  [facility];  Service: Gastroenterology;;  ? BIOPSY  08/26/2020  ? Procedure: BIOPSY;  Surgeon: Dolores Frame, MD;  [facility];  Service: Gastroenterology;;  ? BIOPSY  10/18/2020  ? Procedure: BIOPSY;  Surgeon: Dolores Frame, MD;  [facility];  Service: Gastroenterology;;  ? CESAREAN SECTION    ? CHOLECYSTECTOMY    ? ESOPHAGEAL DILATION N/A 02/23/2020  ? Procedure: ESOPHAGEAL DILATION;  Surgeon: Marguerita Merles, Reuel Boom, MD;  [facility];  Service: Gastroenterology;  Laterality: N/A;  ? ESOPHAGOGASTRODUODENOSCOPY N/A 09/25/2018  ? Procedure: ESOPHAGOGASTRODUODENOSCOPY (EGD);  Surgeon: Malissa Hippo, MD;  [facility];  Service: Endoscopy;  Laterality: N/A;  1200  ? ESOPHAGOGASTRODUODENOSCOPY (EGD) WITH PROPOFOL N/A 02/23/2020  ? Procedure: ESOPHAGOGASTRODUODENOSCOPY (EGD) WITH PROPOFOL;  Surgeon: Dolores Frame, MD;  [facility];  Service: Gastroenterology;  Laterality: N/A;  9:30  ? ESOPHAGOGASTRODUODENOSCOPY (EGD) WITH PROPOFOL N/A 05/24/2020  ? Procedure: ESOPHAGOGASTRODUODENOSCOPY (EGD) WITH  PROPOFOL;  Surgeon: Dolores Frame, MD;  [facility];  Service: Gastroenterology;  Laterality: N/A;  AM  ? ESOPHAGOGASTRODUODENOSCOPY (EGD) WITH PROPOFOL N/A 08/26/2020  ? Procedure: ESOPHAGOGASTRODUODENOSCOPY (EGD) WITH PROPOFOL;  Surgeon: Dolores Frame, MD;  [facility];  Service: Gastroenterology;  Laterality: N/A;  1:45  ? ESOPHAGOGASTRODUODENOSCOPY (EGD) WITH PROPOFOL N/A 10/18/2020  ? Procedure: ESOPHAGOGASTRODUODENOSCOPY (EGD) WITH PROPOFOL;  Surgeon: Dolores Frame, MD;  [facility];  Service: Gastroenterology;  Laterality: N/A;  12:00  ? PARTIAL HYSTERECTOMY    ? TONSILLECTOMY    ? ? ?Family History: ?History reviewed. No pertinent family history. ? ?Social History:  ? reports that she quit smoking about 4 months ago. Her smoking use included cigarettes. She smoked an average of .5 packs per day. She has never used smokeless tobacco. She reports that she does not drink alcohol and does not use drugs. ? ?Medications: ?Medications Prior to Admission  ?Medication Sig Dispense Refill  ? acetaminophen (TYLENOL) 325 MG tablet Take 650 mg by mouth every 6 (six) hours as needed for moderate pain or headache.    ? bismuth-metronidazole-tetracycline (PYLERA) 140-125-125 MG capsule Take 3 caps QID for 14 days 120 capsule 1  ? busPIRone (BUSPAR) 10 MG tablet Take 10 mg by mouth 2 (two) times daily as needed for anxiety.    ? cetirizine (ZYRTEC) 10 MG tablet Take 10 mg by mouth daily.    ? dicyclomine (BENTYL) 10 MG capsule Take 1 capsule (10 mg total) by mouth 3 (three) times daily as needed (abd pain). 60 capsule  1  ? escitalopram (LEXAPRO) 20 MG tablet Take 20 mg by mouth daily.    ? gabapentin (NEURONTIN) 300 MG capsule Take 300 mg by mouth at bedtime as needed (pain).    ? hydrOXYzine (VISTARIL) 25 MG capsule Take 25 mg by mouth 2 (two) times daily as needed for anxiety.    ? lansoprazole (PREVACID) 30 MG capsule Take 2 capsules (60 mg total) by  mouth 2 (two) times daily before a meal. 120 capsule 5  ? sucralfate (CARAFATE) 1 g tablet Take 1 tablet (1 g total) by mouth 4 (four) times daily. 120 tablet 0  ? traZODone (DESYREL) 100 MG tablet Take 50-100 mg by mouth at bedtime.    ? ? ?No results found for this or any previous visit (from the past 48 hour(s)). ? ?DG MINI C-ARM IMAGE ONLY ? ?Result Date: 04/27/2021 ?There is no interpretation for this exam.  This order is for images obtained during a surgical procedure.  Please See "Surgeries" Tab for more information regarding the procedure.   ? ? ? ?Blood pressure 107/71, pulse 86, temperature (!) 97.3 ?F (36.3 ?C), temperature source Oral, resp. rate 16, height 5\' 5"  (1.651 m), weight 95.8 kg, SpO2 98 %. ? ?General appearance: alert, cooperative, and appears stated age ?Head: Normocephalic, without obvious abnormality, atraumatic ?Neck: supple, symmetrical, trachea midline ?Extremities: Intact sensation and capillary refill all digits.  +epl/fpl/io.  No wounds.  ?Pulses: 2+ and symmetric ?Skin: Skin color, texture, turgor normal. No rashes or lesions ?Neurologic: Grossly normal ?Incision/Wound: none ? ?Assessment/Plan ?Left scaphoid fracture.  Plan ORIF left scaphoid fracture with bone graft from distal radius.  Non operative and operative treatment options have been discussed with the patient and patient wishes to proceed with operative treatment. Risks, benefits, and alternatives of surgery have been discussed and the patient agrees with the plan of care.  ? ? ?04/27/2021, 8:43 AM ? ? ?

## 2021-04-27 NOTE — Discharge Instructions (Addendum)
[facility] Instructions ?Hand Surgery ? ?Wound Care: ?Keep your hand elevated above the level of your heart.  Do not allow it to dangle by your side.  Keep the dressing dry and do not remove it unless your doctor advises you to do so.  He will usually change it at the time of your post-op visit.  Moving your fingers is advised to stimulate circulation but will depend on the site of your surgery.  If you have a splint applied, your doctor will advise you regarding movement. ? ?Activity: ?Do not drive or operate machinery today.  Rest today and then you may return to your normal activity and work as indicated by your physician. ? ?Diet:  ?Drink liquids today or eat a light diet.  You may resume a regular diet tomorrow.   ? ?General expectations: ?Pain for two to three days. ?Fingers may become slightly swollen. ? ?Call your doctor if any of the following occur: ?Severe pain not relieved by pain medication. ?Elevated temperature. ?Dressing soaked with blood. ?Inability to move fingers. ?White or bluish color to fingers. ? ?No tylenol until after 1:30pm today, if needed. ? ? ?Post Anesthesia Home Care Instructions ? ?Activity: ?Get plenty of rest for the remainder of the day. A responsible individual must stay with you for 24 hours following the procedure.  ?For the next 24 hours, DO NOT: ?-Drive a car ?-Advertising copywriter ?-Drink alcoholic beverages ?-Take any medication unless instructed by your physician ?-Make any legal decisions or sign important papers. ? ?Meals: ?Start with liquid foods such as gelatin or soup. Progress to regular foods as tolerated. Avoid greasy, spicy, heavy foods. If nausea and/or vomiting occur, drink only clear liquids until the nausea and/or vomiting subsides. Call your physician if vomiting continues. ? ?Special Instructions/Symptoms: ?Your throat may feel dry or sore from the anesthesia or the breathing tube placed in your throat during surgery. If this causes discomfort, gargle with warm  salt water. The discomfort should disappear within 24 hours. ? ?If you had a scopolamine patch placed behind your ear for the management of post- operative nausea and/or vomiting: ? ?1. The medication in the patch is effective for 72 hours, after which it should be removed.  Wrap patch in a tissue and discard in the trash. Wash hands thoroughly with soap and water. ?2. You may remove the patch earlier than 72 hours if you experience unpleasant side effects which may include dry mouth, dizziness or visual disturbances. ?3. Avoid touching the patch. Wash your hands with soap and water after contact with the patch. ?   Regional Anesthesia Blocks ? ?1. Numbness or the inability to move the "blocked" extremity may last from 3-48 hours after placement. The length of time depends on the medication injected and your individual response to the medication. If the numbness is not going away after 48 hours, call your surgeon. ? ?2. The extremity that is blocked will need to be protected until the numbness is gone and the  Strength has returned. Because you cannot feel it, you will need to take extra care to avoid injury. Because it may be weak, you may have difficulty moving it or using it. You may not know what position it is in without looking at it while the block is in effect. ? ?3. For blocks in the legs and feet, returning to weight bearing and walking needs to be done carefully. You will need to wait until the numbness is entirely gone and the strength has  returned. You should be able to move your leg and foot normally before you try and bear weight or walk. You will need someone to be with you when you first try to ensure you do not fall and possibly risk injury. ? ?4. Bruising and tenderness at the needle site are common side effects and will resolve in a few days. ? ?5. Persistent numbness or new problems with movement should be communicated to the surgeon or the [facility] [phone] [facility] [phone]).  ?

## 2021-04-27 NOTE — Anesthesia Procedure Notes (Signed)
Anesthesia Regional Block: Supraclavicular block  ? ?Pre-Anesthetic Checklist: , timeout performed,  Correct Patient, Correct Site, Correct Laterality,  Correct Procedure, Correct Position, site marked,  Risks and benefits discussed,  Surgical consent,  Pre-op evaluation,  At surgeon's request and post-op pain management ? ?Laterality: Left ? ?Prep: chloraprep     ?  ?Needles:  ?Injection technique: Single-shot ? ?Needle Type: Echogenic Stimulator Needle   ? ? ?Needle Length: 9cm  ?Needle Gauge: 21  ? ? ? ?Additional Needles: ? ? ?Procedures:,,,, ultrasound used (permanent image in chart),,    ?Narrative:  ?Start time: 04/27/2021 8:40 AM ?End time: 04/27/2021 8:50 AM ?Injection made incrementally with aspirations every 5 mL. ? ?Performed by: Personally  ?Anesthesiologist: Leonides Grills, MD ? ?Additional Notes: ?Functioning IV was confirmed and monitors were applied.  A timeout was performed. Sterile prep, hand hygiene and sterile gloves were used. A 79mm 21ga Arrow echogenic stimulator needle was used. Negative aspiration and negative test dose prior to incremental administration of local anesthetic. The patient tolerated the procedure well. ? ?Ultrasound guidance: relevent anatomy identified, needle position confirmed, local anesthetic spread visualized around nerve(s), vascular puncture avoided.  Image printed for medical record.  ? ? ? ? ? ? ?

## 2021-04-27 NOTE — Anesthesia Procedure Notes (Signed)
Procedure Name: LMA Insertion ?Date/Time: 04/27/2021 9:14 AM ?Performed by: Alford Highland, CRNA ?Pre-anesthesia Checklist: Patient identified, Emergency Drugs available, Suction available and Patient being monitored ?Patient Re-evaluated:Patient Re-evaluated prior to induction ?Oxygen Delivery Method: Circle System Utilized ?Preoxygenation: Pre-oxygenation with 100% oxygen ?Induction Type: IV induction ?Ventilation: Mask ventilation without difficulty ?LMA: LMA inserted ?LMA Size: 4.0 ?Number of attempts: 1 ?Airway Equipment and Method: Bite block ?Placement Confirmation: positive ETCO2 ?Tube secured with: Tape ?Dental Injury: Teeth and Oropharynx as per pre-operative assessment  ? ? ? ? ?

## 2021-04-27 NOTE — Op Note (Addendum)
NAME: Angela Kent ?MEDICAL RECORD NO: [id number] ?DATE OF BIRTH: 13-Dec-1985 ?FACILITY: [facility] ?LOCATION: [place] ?PHYSICIAN: Tennis Must, MD ?  ?OPERATIVE REPORT ?  ?DATE OF PROCEDURE: 04/27/21  ?  ?PREOPERATIVE DIAGNOSIS: Left scaphoid nonunion ?  ?POSTOPERATIVE DIAGNOSIS: Left scaphoid nonunion ?  ?PROCEDURE: ?1.  Open reduction internal fixation left scaphoid nonunion with bone graft ?2.  Harvest bone graft from left distal radius via separate incision ?  ?SURGEON:  Leanora Cover, M.D. ?  ?ASSISTANT: Daryll Brod, MD ?  ?ANESTHESIA:  General with regional ?  ?INTRAVENOUS FLUIDS:  Per anesthesia flow sheet. ?  ?ESTIMATED BLOOD LOSS:  Minimal. ?  ?COMPLICATIONS:  None. ?  ?SPECIMENS:  none ?  ?TOURNIQUET TIME:   ? ?Total Tourniquet Time Documented: ?Upper Arm (Left) - 75 minutes ?Total: Upper Arm (Left) - 75 minutes ? ?  ?DISPOSITION:  Stable to PACU. ?  ?INDICATIONS: 36 year old female sustained fracture to the left scaphoid.  She is treated this with splinting and bone stimulator without healing.  CT confirms incomplete healing of the fracture.  She wishes to proceed with operative fixation and bone grafting.  Risks, benefits and alternatives of surgery were discussed including the risks of blood loss, infection, damage to nerves, vessels, tendons, ligaments, bone for surgery, need for additional surgery, complications with wound healing, continued pain, stiffness, , nonunion, malunion.  She voiced understanding of these risks and elected to proceed. ? ?OPERATIVE COURSE:  After being identified preoperatively by myself,  the patient and I agreed on the procedure and site of the procedure.  The surgical site was marked.  Surgical consent had been signed. She was given IV antibiotics as preoperative antibiotic prophylaxis. She was transferred to the operating room and placed on the operating table in supine position with the Left upper extremity on an arm board.  General anesthesia was induced  by the anesthesiologist. A regional block had been performed by anesthesia in preoperative holding.    Left upper extremity was prepped and draped in normal sterile orthopedic fashion.  A surgical pause was performed between the surgeons, anesthesia, and operating room staff and all were in agreement as to the patient, procedure, and site of procedure.  Tourniquet at the proximal aspect of the extremity was inflated to 250 mmHg after exsanguination of the arm with an Esmarch bandage.  Incision was made at the volar aspect of the wrist and carried in subcutaneous tissues by spreading technique.  Interval between the radial artery and FCR tendon was made.  Bipolar electrocautery was used to obtain hemostasis.  There was a branch of radial artery that had to be bipolared to access the scaphoid.  The capsule and radioscaphocapitate ligament were sharply incised.  The scaphoid was visualized.  The fracture was identified with the assistance of C arm.  The knife blade was able to be placed into the nonunion site.  The nonunion was then spread and taken down.  Curette was used to remove nonhealing bone.  There had been collapse at the volar aspect.  This was reduced.  A separate incision was then made at the dorsum of the wrist and carried into subcutaneous tissues by spreading technique.  Lister's tubercle was identified.  The EPL tendon was identified and protected.  The periosteum was incised over top of Lister's tubercle and a K wire used to create a window in the dorsal bone around Lister's tubercle.  This was removed and held for later replacement.  Cancellous bone graft was  harvested with a curette.  The volar wound was copiously irrigated with sterile saline.  This bone graft was then placed into the scaphoid nonunion.  A rondure was used to create a trough in the trapezium to allow better placement of the guidepin.  The guidepin for the mini AccuTrack screw was then placed from the distal pole the scaphoid across  the fracture site into the proximal pole.  C-arm was used in AP lateral and oblique projections to ensure appropriate position of the guidepin and reduction of the fracture which was the case.  The guidepin was measured.  A 16 mm screw was selected.  The opening drill was used and then the screw placed across the fracture site.  Good purchase was obtained.  The C-arm was again used in AP lateral and oblique projections to ensure appropriate position of the hardware and reduction of the fracture which was the case.  Additional bone graft was then placed at the nonunion site.  The radioscaphocapitate and capsule were repaired with a 4-0 Vicryl suture in a figure-of-eight fashion.  Inverted interrupted Vicryl sutures were placed in subcutaneous tissues and skin was closed with 4-0 nylon in a horizontal mattress fashion.  Gelfoam was then placed into the bone graft harvest site at the dorsal wound.  The cortical cap was then replaced.  The periosteum was repaired back over top with a 4-0 Vicryl suture.  Inverted interrupted Vicryl sutures were placed in subcutaneous tissues and skin was closed with 4-0 nylon in a horizontal mattress fashion.  The wounds were dressed with sterile Xeroform 4 x 4's and wrapped with a Kerlix bandage.  A thumb spica splint was placed and wrapped with Kerlix and Ace bandage.  The tourniquet was deflated at 75 minutes.  Fingertips were pink with brisk capillary refill after deflation of tourniquet.  The operative  drapes were broken down.  The patient was awoken from anesthesia safely.  She was transferred back to the stretcher and taken to PACU in stable condition.  I will see her back in the office in 1 week for postoperative followup.  I will give her a prescription for Percocet 5/325 1-2 tabs PO q6 hours prn pain, dispense # 20. ? ? ?Leanora Cover, MD ?Electronically signed, 04/27/21 ?

## 2021-04-28 ENCOUNTER — Encounter (HOSPITAL_BASED_OUTPATIENT_CLINIC_OR_DEPARTMENT_OTHER): Payer: Self-pay | Admitting: Orthopedic Surgery

## 2021-06-01 ENCOUNTER — Ambulatory Visit (INDEPENDENT_AMBULATORY_CARE_PROVIDER_SITE_OTHER): Payer: Medicaid Other | Admitting: Gastroenterology

## 2021-06-05 ENCOUNTER — Encounter (INDEPENDENT_AMBULATORY_CARE_PROVIDER_SITE_OTHER): Payer: Self-pay

## 2021-06-05 ENCOUNTER — Encounter (INDEPENDENT_AMBULATORY_CARE_PROVIDER_SITE_OTHER): Payer: Self-pay | Admitting: Gastroenterology

## 2021-06-05 ENCOUNTER — Ambulatory Visit (INDEPENDENT_AMBULATORY_CARE_PROVIDER_SITE_OTHER): Payer: Medicaid Other | Admitting: Gastroenterology

## 2021-06-05 VITALS — BP 107/74 | HR 74 | Temp 97.5°F | Ht 65.0 in | Wt 212.6 lb

## 2021-06-05 DIAGNOSIS — R1110 Vomiting, unspecified: Secondary | ICD-10-CM | POA: Diagnosis not present

## 2021-06-05 DIAGNOSIS — K259 Gastric ulcer, unspecified as acute or chronic, without hemorrhage or perforation: Secondary | ICD-10-CM

## 2021-06-05 NOTE — Progress Notes (Signed)
Angela Kent, M.D. ?Gastroenterology & Hepatology ?[facility] ?[address] ?[place], [place] [address] ? ?Primary Care Physician: ?No primary care provider on file. ?No primary provider on file. ? ?I will communicate my assessment and recommendations to the referring MD via EMR. ? ?Problems: ?Recurrent gastric ulcers ?Chronic right upper quadrant abdominal pain ?Family Hx rectal cancer ?History of H. Pylori. 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 ?History of Present Illness: ?Angela Kent is a 36 y.o. female with PMH GERD, ashtma, depression and previous H. pylori, who presents for follow up of right upper quadrant abdominal pain and recurrent ulcers. ? ?The patient was last seen on 04/03/2021. At that time, the patient was given a quadruple bismuth therapy regimen for H. pylori. ? ?Patient states she is still having pain in the RUQ. States it did not improve despite taking the quadruple bismuth regimen.  States that she is "losing hope as I have already excepted I will live with this pain". ? ?Has not been smoking anymore. Has tried to eat healthier as well. She has been taking Prevacid 60 mg twice a day. ? ?After finishing the medication, she had regurgitation of food. Now it is only eating when she eats too much so is less severe than before.  ? ?Has lost on purpose 1 lb with dietary changes. ? ?The patient denies having any nausea, vomiting, fever, chills, hematochezia, melena, hematemesis, abdominal distention, iarrhea, jaundice, pruritus or weight loss. ? ?Last EGD: 10/18/2020 ?The examined esophagus was normal. ?Findings: ?Three non-obstructing non-bleeding superficial gastric ulcers with a clean ulcer base (Forrest Class III) were found in the gastric antrum. The largest lesion was 5 mm in largest dimension. There were also some associated erosions in the gastric body without presence of active bleeding or stigmata of recent bleeding.  Biopsies from body and antrum were taken with a cold forceps for Helicobacter pylori testing. ?The examined duodenum was normal. ?  ?Path: ?A. STOMACH, BIOPSY:  ?- Gastric mucosa with focal erosion, hyperemia and reactive changes.  ?- Warthin-Starry negative for Helicobacter pylori.  ?- No intestinal metaplasia, dysplasia or carcinoma.  ?  ?Last Colonoscopy: never ? ?Past Medical History: ?Past Medical History:  ?Diagnosis Date  ? Acid reflux   ? Anxiety   ? Asthma   ? Depression   ? Stomach ulcer   ? ? ?Past Surgical History: ?Past Surgical History:  ?Procedure Laterality Date  ? APPENDECTOMY    ? BIOPSY  09/25/2018  ? Procedure: BIOPSY;  Surgeon: Malissa Hippo, MD;  [facility];  Service: Endoscopy;;  ? BIOPSY  02/23/2020  ? Procedure: BIOPSY;  Surgeon: Dolores Frame, MD;  [facility];  Service: Gastroenterology;;  ? BIOPSY  05/24/2020  ? Procedure: BIOPSY;  Surgeon: Dolores Frame, MD;  [facility];  Service: Gastroenterology;;  ? BIOPSY  08/26/2020  ? Procedure: BIOPSY;  Surgeon: Dolores Frame, MD;  [facility];  Service: Gastroenterology;;  ? BIOPSY  10/18/2020  ? Procedure: BIOPSY;  Surgeon: Dolores Frame, MD;  [facility];  Service: Gastroenterology;;  ? CESAREAN SECTION    ? CHOLECYSTECTOMY    ? ESOPHAGEAL DILATION N/A 02/23/2020  ? Procedure: ESOPHAGEAL DILATION;  Surgeon: Marguerita Merles, Reuel Boom, MD;  [facility];  Service: Gastroenterology;  Laterality: N/A;  ? ESOPHAGOGASTRODUODENOSCOPY N/A 09/25/2018  ? Procedure: ESOPHAGOGASTRODUODENOSCOPY (EGD);  Surgeon: Malissa Hippo, MD;  [facility];  Service: Endoscopy;  Laterality: N/A;  1200  ? ESOPHAGOGASTRODUODENOSCOPY (EGD) WITH PROPOFOL N/A 02/23/2020  ? Procedure: ESOPHAGOGASTRODUODENOSCOPY (EGD) WITH PROPOFOL;  Surgeon: Dolores Frame, MD;  [facility];  Service: Gastroenterology;  Laterality: N/A;  9:30   ? ESOPHAGOGASTRODUODENOSCOPY (EGD) WITH PROPOFOL N/A 05/24/2020  ? Procedure: ESOPHAGOGASTRODUODENOSCOPY (EGD) WITH PROPOFOL;  Surgeon: Dolores Frame, MD;  [facility];  Service: Gastroenterology;  Laterality: N/A;  AM  ? ESOPHAGOGASTRODUODENOSCOPY (EGD) WITH PROPOFOL N/A 08/26/2020  ? Procedure: ESOPHAGOGASTRODUODENOSCOPY (EGD) WITH PROPOFOL;  Surgeon: Dolores Frame, MD;  [facility];  Service: Gastroenterology;  Laterality: N/A;  1:45  ? ESOPHAGOGASTRODUODENOSCOPY (EGD) WITH PROPOFOL N/A 10/18/2020  ? Procedure: ESOPHAGOGASTRODUODENOSCOPY (EGD) WITH PROPOFOL;  Surgeon: Dolores Frame, MD;  [facility];  Service: Gastroenterology;  Laterality: N/A;  12:00  ? OPEN REDUCTION INTERNAL FIXATION (ORIF) SCAPHOID WITH DISTAL RADIUS GRAFT Left 04/27/2021  ? Procedure: OPEN REDUCTION INTERNAL FIXATION (ORIF) LEFT SCAPHOID WITH BONE GRAFT;  Surgeon: Betha Loa, MD;  Location: [facility];  Service: Orthopedics;  Laterality: Left;  ? PARTIAL HYSTERECTOMY    ? TONSILLECTOMY    ? ? ?Family History:History reviewed. No pertinent family history. ? ?Social History: ?Social History  ? ?Tobacco Use  ?Smoking Status Former  ? Packs/day: 0.50  ? Types: Cigarettes  ? Quit date: 12/2020  ? Years since quitting: 0.4  ?Smokeless Tobacco Never  ? ?Social History  ? ?Substance and Sexual Activity  ?Alcohol Use Never  ? ?Social History  ? ?Substance and Sexual Activity  ?Drug Use Never  ? ? ?Allergies: ?Allergies  ?Allergen Reactions  ? Codeine Hives  ? ? ?Medications: ?Current Outpatient Medications  ?Medication Sig Dispense Refill  ? busPIRone (BUSPAR) 10 MG tablet Take 10 mg by mouth 2 (two) times daily as needed for anxiety.    ? cetirizine (ZYRTEC) 10 MG tablet Take 10 mg by mouth daily.    ? dicyclomine (BENTYL) 10 MG capsule Take 1 capsule (10 mg total) by mouth 3 (three) times daily as needed (abd pain). 60 capsule 1  ? escitalopram (LEXAPRO) 20 MG  tablet Take 20 mg by mouth daily.    ? gabapentin (NEURONTIN) 300 MG capsule Take 300 mg by mouth at bedtime as needed (pain).    ? hydrOXYzine (VISTARIL) 25 MG capsule Take 25 mg by mouth 2 (two) times daily as needed for anxiety.    ? lansoprazole (PREVACID) 30 MG capsule Take 2 capsules (60 mg total) by mouth 2 (two) times daily before a meal. 120 capsule 5  ? sucralfate (CARAFATE) 1 g tablet Take 1 tablet (1 g total) by mouth 4 (four) times daily. 120 tablet 0  ? traZODone (DESYREL) 100 MG tablet Take 50-100 mg by mouth at bedtime.    ? bismuth-metronidazole-tetracycline (PYLERA) 140-125-125 MG capsule Take 3 caps QID for 14 days (Patient not taking: Reported on 06/05/2021) 120 capsule 1  ? ?No current facility-administered medications for this visit.  ? ? ?Review of Systems: ?GENERAL: negative for malaise, night sweats ?HEENT: No changes in hearing or vision, no nose bleeds or other nasal problems. ?NECK: Negative for lumps, goiter, pain and significant neck swelling ?RESPIRATORY: Negative for cough, wheezing ?CARDIOVASCULAR: Negative for chest pain, leg swelling, palpitations, orthopnea ?GI: SEE HPI ?MUSCULOSKELETAL: Negative for joint pain or swelling, back pain, and muscle pain. ?SKIN: Negative for lesions, rash ?PSYCH: Negative for sleep disturbance, mood disorder and recent psychosocial stressors. ?HEMATOLOGY Negative for prolonged bleeding, bruising easily,  and swollen nodes. ?ENDOCRINE: Negative for cold or heat intolerance, polyuria, polydipsia and goiter. ?NEURO: negative for tremor, gait imbalance, syncope and seizures. ?The remainder of the review of systems is noncontributory. ? ? ?Physical Exam: ?BP 107/74 (BP Location: Left Arm, Patient Position: Sitting, Cuff Size: Large)   Pulse 74   Temp (!) 97.5 ?F (36.4 ?C) (Oral)   Ht  (1.651 m)   Wt 212 lb 9.6 oz (96.4 kg)   BMI 35.38 kg/m?  ?GENERAL: The patient is AO x3, in no acute distress. ?HEENT: Head is normocephalic and atraumatic. EOMI are  intact. Mouth is well hydrated and without lesions. ?NECK: Supple. No masses ?LUNGS: Clear to auscultation. No presence of rhonchi/wheezing/rales. Adequate chest expansion ?HEART: RRR, normal s1 and s2. ?ABDOMEN

## 2021-06-05 NOTE — Patient Instructions (Signed)
Schedule CT angio abdomen/pelvis with IV contrast ?Continue Prevacid 60 mg twice a day ?If negative imaging, will need to proceed with repeat EGD ? ?

## 2021-07-04 ENCOUNTER — Encounter (INDEPENDENT_AMBULATORY_CARE_PROVIDER_SITE_OTHER): Payer: Self-pay | Admitting: Gastroenterology

## 2021-07-05 ENCOUNTER — Other Ambulatory Visit (INDEPENDENT_AMBULATORY_CARE_PROVIDER_SITE_OTHER): Payer: Self-pay

## 2021-07-05 ENCOUNTER — Encounter (INDEPENDENT_AMBULATORY_CARE_PROVIDER_SITE_OTHER): Payer: Self-pay

## 2021-07-05 DIAGNOSIS — K259 Gastric ulcer, unspecified as acute or chronic, without hemorrhage or perforation: Secondary | ICD-10-CM

## 2021-07-05 DIAGNOSIS — R1013 Epigastric pain: Secondary | ICD-10-CM

## 2021-07-07 ENCOUNTER — Encounter (HOSPITAL_COMMUNITY): Payer: Self-pay

## 2021-07-07 ENCOUNTER — Encounter (HOSPITAL_COMMUNITY)
Admission: RE | Admit: 2021-07-07 | Discharge: 2021-07-07 | Disposition: A | Discharge status: Home / Self Care | Payer: Medicaid Other | Source: Ambulatory Visit | Attending: Gastroenterology | Admitting: Gastroenterology

## 2021-07-11 ENCOUNTER — Ambulatory Visit (HOSPITAL_COMMUNITY)
Admission: RE | Admit: 2021-07-11 | Discharge: 2021-07-11 | Disposition: A | Discharge status: Home / Self Care | Payer: Medicaid Other | Attending: Gastroenterology | Admitting: Gastroenterology

## 2021-07-11 ENCOUNTER — Other Ambulatory Visit: Payer: Self-pay

## 2021-07-11 ENCOUNTER — Encounter (HOSPITAL_COMMUNITY)
Admission: RE | Disposition: A | Discharge status: Home / Self Care | Payer: Self-pay | Source: Home / Self Care | Attending: Gastroenterology

## 2021-07-11 ENCOUNTER — Encounter (HOSPITAL_COMMUNITY): Payer: Self-pay | Admitting: Gastroenterology

## 2021-07-11 ENCOUNTER — Ambulatory Visit (HOSPITAL_BASED_OUTPATIENT_CLINIC_OR_DEPARTMENT_OTHER): Payer: Medicaid Other | Admitting: Anesthesiology

## 2021-07-11 ENCOUNTER — Ambulatory Visit (HOSPITAL_COMMUNITY): Payer: Medicaid Other | Admitting: Anesthesiology

## 2021-07-11 DIAGNOSIS — F32A Depression, unspecified: Secondary | ICD-10-CM | POA: Diagnosis not present

## 2021-07-11 DIAGNOSIS — K257 Chronic gastric ulcer without hemorrhage or perforation: Secondary | ICD-10-CM | POA: Diagnosis not present

## 2021-07-11 DIAGNOSIS — K3189 Other diseases of stomach and duodenum: Secondary | ICD-10-CM

## 2021-07-11 DIAGNOSIS — Z79899 Other long term (current) drug therapy: Secondary | ICD-10-CM | POA: Diagnosis not present

## 2021-07-11 DIAGNOSIS — F419 Anxiety disorder, unspecified: Secondary | ICD-10-CM | POA: Insufficient documentation

## 2021-07-11 DIAGNOSIS — K277 Chronic peptic ulcer, site unspecified, without hemorrhage or perforation: Secondary | ICD-10-CM

## 2021-07-11 DIAGNOSIS — F17210 Nicotine dependence, cigarettes, uncomplicated: Secondary | ICD-10-CM | POA: Insufficient documentation

## 2021-07-11 DIAGNOSIS — K219 Gastro-esophageal reflux disease without esophagitis: Secondary | ICD-10-CM | POA: Insufficient documentation

## 2021-07-11 DIAGNOSIS — K259 Gastric ulcer, unspecified as acute or chronic, without hemorrhage or perforation: Secondary | ICD-10-CM

## 2021-07-11 DIAGNOSIS — R1013 Epigastric pain: Secondary | ICD-10-CM

## 2021-07-11 HISTORY — PX: ESOPHAGOGASTRODUODENOSCOPY (EGD) WITH PROPOFOL: SHX5813

## 2021-07-11 HISTORY — PX: BIOPSY: SHX5522

## 2021-07-11 SURGERY — ESOPHAGOGASTRODUODENOSCOPY (EGD) WITH PROPOFOL
Anesthesia: General

## 2021-07-11 MED ORDER — LIDOCAINE HCL (CARDIAC) PF 50 MG/5ML IV SOSY
PREFILLED_SYRINGE | INTRAVENOUS | Status: DC | PRN
  Administered 2021-07-11: 100 mg via INTRAVENOUS

## 2021-07-11 MED ORDER — LACTATED RINGERS IV SOLN: INTRAVENOUS | Status: DC

## 2021-07-11 MED ORDER — SUCRALFATE 1 G PO TABS: 1.0000 g | ORAL_TABLET | Freq: Four times a day (QID) | ORAL | 0 refills | Status: DC

## 2021-07-11 MED ORDER — PROPOFOL 10 MG/ML IV BOLUS
INTRAVENOUS | Status: DC | PRN
  Administered 2021-07-11: 150 mg via INTRAVENOUS
  Administered 2021-07-11: 40 mg via INTRAVENOUS
  Administered 2021-07-11: 30 mg via INTRAVENOUS

## 2021-07-11 NOTE — Anesthesia Preprocedure Evaluation (Signed)
Anesthesia Evaluation  Patient identified by MRN, date of birth, ID band Patient awake    Reviewed: Allergy & Precautions, H&P , NPO status , Patient's Chart, lab work & pertinent test results, reviewed documented beta blocker date and time   Airway Mallampati: II  TM Distance: >3 FB Neck ROM: full    Dental no notable dental hx.    Pulmonary neg pulmonary ROS, Current Smoker,    Pulmonary exam normal breath sounds clear to auscultation       Cardiovascular Exercise Tolerance: Good negative cardio ROS   Rhythm:regular Rate:Normal     Neuro/Psych PSYCHIATRIC DISORDERS Anxiety Depression negative neurological ROS     GI/Hepatic Neg liver ROS, PUD, GERD  Medicated,  Endo/Other  negative endocrine ROS  Renal/GU negative Renal ROS  negative genitourinary   Musculoskeletal   Abdominal   Peds  Hematology negative hematology ROS (+)   Anesthesia Other Findings   Reproductive/Obstetrics negative OB ROS                             Anesthesia Physical Anesthesia Plan  ASA: 2  Anesthesia Plan: General   Post-op Pain Management:    Induction:   PONV Risk Score and Plan: Propofol infusion  Airway Management Planned:   Additional Equipment:   Intra-op Plan:   Post-operative Plan:   Informed Consent: I have reviewed the patients History and Physical, chart, labs and discussed the procedure including the risks, benefits and alternatives for the proposed anesthesia with the patient or authorized representative who has indicated his/her understanding and acceptance.     Dental Advisory Given  Plan Discussed with: CRNA  Anesthesia Plan Comments:         Anesthesia Quick Evaluation

## 2021-07-11 NOTE — Anesthesia Procedure Notes (Signed)
Date/Time: 07/11/2021 9:44 AM Performed by: Franco Nones, CRNA Pre-anesthesia Checklist: Patient identified, Emergency Drugs available, Suction available, Timeout performed and Patient being monitored Patient Re-evaluated:Patient Re-evaluated prior to induction Oxygen Delivery Method: Nasal Cannula

## 2021-07-11 NOTE — Op Note (Signed)
[facility] Patient Name: Angela Kent Procedure Date: 07/11/2021 9:36 AM MRN: [id number] Date of Birth: 09-22-1985 Attending MD: Katrinka Blazing ,  CSN: [id number] Age: 35 Admit Type: Outpatient Procedure:                Upper GI endoscopy Indications:              Follow-up of chronic peptic ulcer Providers:                Katrinka Blazing, Buel Ream. Thomasena Edis RN, RN, Cyril Mourning, Technician Referring MD:              Medicines:                Monitored Anesthesia Care Complications:            No immediate complications. Estimated Blood Loss:     Estimated blood loss: none. Procedure:                Pre-Anesthesia Assessment:                           - Prior to the procedure, a History and Physical                            was performed, and patient medications, allergies                            and sensitivities were reviewed. The patient's                            tolerance of previous anesthesia was reviewed.                           - The risks and benefits of the procedure and the                            sedation options and risks were discussed with the                            patient. All questions were answered and informed                            consent was obtained.                           - ASA Grade Assessment: II - A patient with mild                            systemic disease.                           After obtaining informed consent, the endoscope was                            passed under direct vision. Throughout  the                            procedure, the patient's blood pressure, pulse, and                            oxygen saturations were monitored continuously. The                            GIF-H190 ([id number]) scope was introduced through the                            mouth, and advanced to the second part of duodenum.                            The upper GI endoscopy was accomplished without                             difficulty. The patient tolerated the procedure                            well. Scope In: 9:51:49 AM Scope Out: 9:58:28 AM Total Procedure Duration: 0 hours 6 minutes 39 seconds  Findings:      The examined esophagus was normal.      One deep non-bleeding cratered gastric ulcer with a clean ulcer base       (Forrest Class III) was found in the gastric antrum. The lesion was 10       mm in largest dimension. This ulcer looked worse than other previous       ulcerations. Biopsies were taken with a cold forceps for histology.      A few localized small erosions with no stigmata of recent bleeding were       found in the gastric fundus. Biopsies were taken with a cold forceps for       Helicobacter pylori testing.      The examined duodenum was normal. Impression:               - Normal esophagus.                           - Non-bleeding gastric ulcer with a clean ulcer                            base (Forrest Class III). Biopsied.                           - Erosive gastropathy with no stigmata of recent                            bleeding. Biopsied.                           - Normal examined duodenum. Moderate Sedation:      Per Anesthesia Care Recommendation:           - Discharge patient to home (ambulatory).                           -  Resume previous diet.                           - Continue Prevacid 60 mg BID                           - Continue Carafate 1 g every 6 hours                           - Will discuss checking H. pylori breath test off                            PPI for at least 1 week, prior to surgical referral                           - Await pathology results. Procedure Code(s):        --- Professional ---                           [phone], Esophagogastroduodenoscopy, flexible,                            transoral; with biopsy, single or multiple Diagnosis Code(s):        --- Professional ---                           K25.9, Gastric  ulcer, unspecified as acute or                            chronic, without hemorrhage or perforation                           K31.89, Other diseases of stomach and duodenum                           K27.7, Chronic peptic ulcer, site unspecified,                            without hemorrhage or perforation CPT copyright 2019 [employer]. All rights reserved. The codes documented in this report are preliminary and upon coder review may  be revised to meet current compliance requirements. Katrinka Blazinganiel Castaneda, MD Katrinka Blazinganiel Castaneda,  07/11/2021 10:05:57 AM This report has been signed electronically. Number of Addenda: 0

## 2021-07-11 NOTE — Discharge Instructions (Signed)
You are being discharged to home.  Resume your previous diet.  We are waiting for your pathology results.  Continue Prevacid 60 mg BID - Continue Carafate 1 g every 6 hours - Will discuss checking H. pylori breath test off PPI for at least 1 week, prior to surgical referral

## 2021-07-11 NOTE — H&P (Addendum)
Angela Kent is an 36 y.o. female.   Chief Complaint: Upper abdominal pain HPI: Angela Kent is a 36 y.o. female with PMH GERD, ashtma, depression and previous H. pylori, who presents for follow up of right upper quadrant abdominal pain and recurrent ulcers.  Patient has presented recurrent ulcers endoscopic evaluation in the past despite using PPI and max dose and undergoing treatment for H. pylori and smoking cessation.  She was scheduled for a CT angio but this was not approved by her insurance.  Past Medical History:  Diagnosis Date   Acid reflux    Anxiety    Asthma    Depression    Stomach ulcer     Past Surgical History:  Procedure Laterality Date   APPENDECTOMY     BIOPSY  09/25/2018   Procedure: BIOPSY;  Surgeon: Malissa Hippo, MD;  [facility];  Service: Endoscopy;;   BIOPSY  02/23/2020   Procedure: BIOPSY;  Surgeon: Dolores Frame, MD;  [facility];  Service: Gastroenterology;;   BIOPSY  05/24/2020   Procedure: BIOPSY;  Surgeon: Dolores Frame, MD;  [facility];  Service: Gastroenterology;;   BIOPSY  08/26/2020   Procedure: BIOPSY;  Surgeon: Dolores Frame, MD;  [facility];  Service: Gastroenterology;;   BIOPSY  10/18/2020   Procedure: BIOPSY;  Surgeon: Dolores Frame, MD;  [facility];  Service: Gastroenterology;;   CESAREAN SECTION     CHOLECYSTECTOMY     ESOPHAGEAL DILATION N/A 02/23/2020   Procedure: ESOPHAGEAL DILATION;  Surgeon: Dolores Frame, MD;  [facility];  Service: Gastroenterology;  Laterality: N/A;   ESOPHAGOGASTRODUODENOSCOPY N/A 09/25/2018   Procedure: ESOPHAGOGASTRODUODENOSCOPY (EGD);  Surgeon: Malissa Hippo, MD;  [facility];  Service: Endoscopy;  Laterality: N/A;  1200   ESOPHAGOGASTRODUODENOSCOPY (EGD) WITH PROPOFOL N/A 02/23/2020   Procedure: ESOPHAGOGASTRODUODENOSCOPY (EGD) WITH PROPOFOL;  Surgeon: Dolores Frame, MD;  [facility];  Service: Gastroenterology;  Laterality: N/A;  9:30   ESOPHAGOGASTRODUODENOSCOPY (EGD) WITH PROPOFOL N/A 05/24/2020   Procedure: ESOPHAGOGASTRODUODENOSCOPY (EGD) WITH PROPOFOL;  Surgeon: Dolores Frame, MD;  [facility];  Service: Gastroenterology;  Laterality: N/A;  AM   ESOPHAGOGASTRODUODENOSCOPY (EGD) WITH PROPOFOL N/A 08/26/2020   Procedure: ESOPHAGOGASTRODUODENOSCOPY (EGD) WITH PROPOFOL;  Surgeon: Dolores Frame, MD;  [facility];  Service: Gastroenterology;  Laterality: N/A;  1:45   ESOPHAGOGASTRODUODENOSCOPY (EGD) WITH PROPOFOL N/A 10/18/2020   Procedure: ESOPHAGOGASTRODUODENOSCOPY (EGD) WITH PROPOFOL;  Surgeon: Dolores Frame, MD;  [facility];  Service: Gastroenterology;  Laterality: N/A;  12:00   OPEN REDUCTION INTERNAL FIXATION (ORIF) SCAPHOID WITH DISTAL RADIUS GRAFT Left 04/27/2021   Procedure: OPEN REDUCTION INTERNAL FIXATION (ORIF) LEFT SCAPHOID WITH BONE GRAFT;  Surgeon: Betha Loa, MD;  Location: [facility];  Service: Orthopedics;  Laterality: Left;   PARTIAL HYSTERECTOMY     TONSILLECTOMY      History reviewed. No pertinent family history. Social History:  reports that she has been smoking cigarettes. She has been smoking an average of .5 packs per day. She has never used smokeless tobacco. She reports that she does not drink alcohol and does not use drugs.  Allergies:  Allergies  Allergen Reactions   Codeine Hives    Medications Prior to Admission  Medication Sig Dispense Refill   busPIRone (BUSPAR) 10 MG tablet Take 10 mg by mouth 2 (two) times daily.  cetirizine (ZYRTEC) 10 MG tablet Take 10 mg by mouth daily.     escitalopram (LEXAPRO) 20 MG tablet Take 20 mg by mouth daily.     gabapentin (NEURONTIN) 300 MG capsule Take 300 mg by mouth at bedtime.     hydrOXYzine (VISTARIL) 25 MG capsule Take 25 mg by mouth in the morning and at bedtime.      lansoprazole (PREVACID) 30 MG capsule Take 2 capsules (60 mg total) by mouth 2 (two) times daily before a meal. 120 capsule 5   sucralfate (CARAFATE) 1 g tablet Take 1 tablet (1 g total) by mouth 4 (four) times daily. 120 tablet 0   traZODone (DESYREL) 100 MG tablet Take 50 mg by mouth at bedtime as needed for sleep.     bismuth-metronidazole-tetracycline (PYLERA) 140-125-125 MG capsule Take 3 caps QID for 14 days 120 capsule 1   dicyclomine (BENTYL) 10 MG capsule Take 1 capsule (10 mg total) by mouth 3 (three) times daily as needed (abd pain). (Patient not taking: Reported on 07/06/2021) 60 capsule 1    No results found for this or any previous visit (from the past 48 hour(s)). No results found.  Review of Systems  Constitutional: Negative.   HENT: Negative.    Eyes: Negative.   Respiratory: Negative.    Cardiovascular: Negative.   Gastrointestinal:  Positive for abdominal pain.  Endocrine: Negative.   Genitourinary: Negative.   Musculoskeletal: Negative.   Skin: Negative.   Allergic/Immunologic: Negative.   Neurological: Negative.   Hematological: Negative.   Psychiatric/Behavioral: Negative.     Blood pressure 107/68, pulse 77, temperature 97.9 F (36.6 C), resp. rate 17, SpO2 98 %. Physical Exam  GENERAL: The patient is AO x3, in no acute distress. HEENT: Head is normocephalic and atraumatic. EOMI are intact. Mouth is well hydrated and without lesions. NECK: Supple. No masses LUNGS: Clear to auscultation. No presence of rhonchi/wheezing/rales. Adequate chest expansion HEART: RRR, normal s1 and s2. ABDOMEN: tender in the upper abdomen, no guarding, no peritoneal signs, and nondistended. BS +. No masses. EXTREMITIES: Without any cyanosis, clubbing, rash, lesions or edema. NEUROLOGIC: AOx3, no focal motor deficit. SKIN: no jaundice, no rashes  Assessment/Plan Angela Kent is a 36 y.o. female with PMH GERD, ashtma, depression and previous H. pylori, who presents for follow  up of right upper quadrant abdominal pain and recurrent ulcers.  We will proceed with EGD.  Dolores Frame, MD 07/11/2021, 9:30 AM

## 2021-07-11 NOTE — Transfer of Care (Signed)
Immediate Anesthesia Transfer of Care Note  Patient: Enid E Luster  Procedure(s) Performed: ESOPHAGOGASTRODUODENOSCOPY (EGD) WITH PROPOFOL BIOPSY  Patient Location: Short Stay  Anesthesia Type:General  Level of Consciousness: awake and patient cooperative  Airway & Oxygen Therapy: Patient Spontanous Breathing  Post-op Assessment: Report given to RN and Post -op Vital signs reviewed and stable  Post vital signs: Reviewed and stable  Last Vitals:  Vitals Value Taken Time  BP 90/54 07/11/21 1002  Temp 36.3 C 07/11/21 1002  Pulse 88 07/11/21 1002  Resp 16 07/11/21 1002  SpO2 97 % 07/11/21 1002    Last Pain:  Vitals:   07/11/21 1002  TempSrc: Oral  PainSc: 0-No pain         Complications: No notable events documented.

## 2021-07-11 NOTE — Anesthesia Postprocedure Evaluation (Signed)
Anesthesia Post Note  Patient: Angela Kent  Procedure(s) Performed: ESOPHAGOGASTRODUODENOSCOPY (EGD) WITH PROPOFOL BIOPSY  Patient location during evaluation: Phase II Anesthesia Type: General Level of consciousness: awake Pain management: pain level controlled Vital Signs Assessment: post-procedure vital signs reviewed and stable Respiratory status: spontaneous breathing and respiratory function stable Cardiovascular status: blood pressure returned to baseline and stable Postop Assessment: no headache and no apparent nausea or vomiting Anesthetic complications: no Comments: Late entry   No notable events documented.   Last Vitals:  Vitals:   07/11/21 0813 07/11/21 1002  BP: 107/68 (!) 90/54  Pulse: 77 88  Resp: 17 16  Temp: 36.6 C (!) 36.3 C  SpO2: 98% 97%    Last Pain:  Vitals:   07/11/21 1002  TempSrc: Oral  PainSc: 0-No pain                 Louann Sjogren

## 2021-07-12 LAB — SURGICAL PATHOLOGY

## 2021-07-14 ENCOUNTER — Other Ambulatory Visit (INDEPENDENT_AMBULATORY_CARE_PROVIDER_SITE_OTHER): Payer: Self-pay | Admitting: Gastroenterology

## 2021-07-14 DIAGNOSIS — A048 Other specified bacterial intestinal infections: Secondary | ICD-10-CM

## 2021-07-17 ENCOUNTER — Encounter (HOSPITAL_COMMUNITY): Payer: Self-pay | Admitting: Gastroenterology

## 2021-08-22 ENCOUNTER — Encounter (INDEPENDENT_AMBULATORY_CARE_PROVIDER_SITE_OTHER): Payer: Self-pay | Admitting: Gastroenterology

## 2021-10-04 ENCOUNTER — Ambulatory Visit: Payer: Self-pay | Admitting: Surgery

## 2021-10-04 DIAGNOSIS — K279 Peptic ulcer, site unspecified, unspecified as acute or chronic, without hemorrhage or perforation: Secondary | ICD-10-CM

## 2021-10-05 ENCOUNTER — Encounter (INDEPENDENT_AMBULATORY_CARE_PROVIDER_SITE_OTHER): Payer: Self-pay | Admitting: Gastroenterology

## 2021-10-05 ENCOUNTER — Ambulatory Visit (INDEPENDENT_AMBULATORY_CARE_PROVIDER_SITE_OTHER): Payer: Medicaid Other | Admitting: Gastroenterology

## 2021-10-27 ENCOUNTER — Other Ambulatory Visit (HOSPITAL_BASED_OUTPATIENT_CLINIC_OR_DEPARTMENT_OTHER): Payer: Self-pay

## 2021-10-27 DIAGNOSIS — G4710 Hypersomnia, unspecified: Secondary | ICD-10-CM

## 2021-10-27 DIAGNOSIS — R0683 Snoring: Secondary | ICD-10-CM

## 2021-10-27 DIAGNOSIS — R5383 Other fatigue: Secondary | ICD-10-CM

## 2021-11-17 NOTE — Pre-Procedure Instructions (Signed)
Surgical Instructions    Your procedure is scheduled on November 29, 2021.  Report to [facility] Main Entrance "A" at 6:30 A.M., then check in with the Admitting office.  Call this number if you have problems the morning of surgery:  [phone]   If you have any questions prior to your surgery date call [phone]: Open Monday-Friday 8am-4pm    Remember:  Do not eat after midnight the night before your surgery  You may drink clear liquids until 5:30 AM the morning of your surgery.   Clear liquids allowed are: Water, Non-Citrus Juices (without pulp), Carbonated Beverages, Clear Tea, Black Coffee Only (NO MILK, CREAM OR POWDERED CREAMER of any kind), and Gatorade.  Patient Instructions  The night before surgery:  No food after midnight. ONLY clear liquids after midnight  The day of surgery (if you do NOT have diabetes):  Drink ONE (1) Pre-Surgery Clear Ensure by 4:30 AM the morning of surgery. Drink in one sitting. Do not sip.  This drink was given to you during your hospital  pre-op appointment visit.  Nothing else to drink after completing the  Pre-Surgery Clear Ensure.         If you have questions, please contact your surgeon's office.      Take these medicines the morning of surgery with A SIP OF WATER:  busPIRone (BUSPAR)   cetirizine (ZYRTEC)   hydrOXYzine (VISTARIL)   omeprazole (PRILOSEC)      As of today, STOP taking any Aspirin (unless otherwise instructed by your surgeon) Aleve, Naproxen, Ibuprofen, Motrin, Advil, Goody's, BC's, all herbal medications, fish oil, and all vitamins.                     Do NOT Smoke (Tobacco/Vaping) for 24 hours prior to your procedure.  If you use a CPAP at night, you may bring your mask/headgear for your overnight stay.   Contacts, glasses, piercing's, hearing aid's, dentures or partials may not be worn into surgery, please bring cases for these belongings.    For patients admitted to the hospital, discharge time will be  determined by your treatment team.   Patients discharged the day of surgery will not be allowed to drive home, and someone needs to stay with them for 24 hours.  SURGICAL WAITING ROOM VISITATION Patients having surgery or a procedure may have no more than 2 support people in the waiting area - these visitors may rotate.   Children under the age of 52 must have an adult with them who is not the patient. If the patient needs to stay at the hospital during part of their recovery, the visitor guidelines for inpatient rooms apply. Pre-op nurse will coordinate an appropriate time for 1 support person to accompany patient in pre-op.  This support person may not rotate.   Please refer to the [facility] website for the visitor guidelines for Inpatients (after your surgery is over and you are in a regular room).    Special instructions:   [facility]- Preparing For Surgery  Before surgery, you can play an important role. Because skin is not sterile, your skin needs to be as free of germs as possible. You can reduce the number of germs on your skin by washing with CHG (chlorahexidine gluconate) Soap before surgery.  CHG is an antiseptic cleaner which kills germs and bonds with the skin to continue killing germs even after washing.    Oral Hygiene is also important to reduce your risk of infection.  Remember - BRUSH YOUR TEETH THE MORNING OF SURGERY WITH YOUR REGULAR TOOTHPASTE  Please do not use if you have an allergy to CHG or antibacterial soaps. If your skin becomes reddened/irritated stop using the CHG.  Do not shave (including legs and underarms) for at least 48 hours prior to first CHG shower. It is OK to shave your face.  Please follow these instructions carefully.   Shower the NIGHT BEFORE SURGERY and the MORNING OF SURGERY  If you chose to wash your hair, wash your hair first as usual with your normal shampoo.  After you shampoo, rinse your hair and body thoroughly to remove the  shampoo.  Use CHG Soap as you would any other liquid soap. You can apply CHG directly to the skin and wash gently with a scrungie or a clean washcloth.   Apply the CHG Soap to your body ONLY FROM THE NECK DOWN.  Do not use on open wounds or open sores. Avoid contact with your eyes, ears, mouth and genitals (private parts). Wash Face and genitals (private parts)  with your normal soap.   Wash thoroughly, paying special attention to the area where your surgery will be performed.  Thoroughly rinse your body with warm water from the neck down.  DO NOT shower/wash with your normal soap after using and rinsing off the CHG Soap.  Pat yourself dry with a CLEAN TOWEL.  Wear CLEAN PAJAMAS to bed the night before surgery  Place CLEAN SHEETS on your bed the night before your surgery  DO NOT SLEEP WITH PETS.   Day of Surgery: Take a shower with CHG soap. Do not wear jewelry or makeup Do not wear lotions, powders, perfumes, or deodorant. Do not shave 48 hours prior to surgery.  Do not bring valuables to the hospital.  [facility] is not responsible for any belongings or valuables. Do not wear nail polish, gel polish, artificial nails, or any other type of covering on natural nails (fingers and toes) If you have artificial nails or gel coating that need to be removed by a nail salon, please have this removed prior to surgery. Artificial nails or gel coating may interfere with anesthesia's ability to adequately monitor your vital signs. Wear Clean/Comfortable clothing the morning of surgery Remember to brush your teeth WITH YOUR REGULAR TOOTHPASTE.   Please read over the following fact sheets that you were given.    If you received a COVID test during your pre-op visit  it is requested that you wear a mask when out in public, stay away from anyone that may not be feeling well and notify your surgeon if you develop symptoms. If you have been in contact with anyone that has tested positive in the  last 10 days please notify you surgeon.

## 2021-11-20 ENCOUNTER — Encounter (HOSPITAL_COMMUNITY): Payer: Self-pay

## 2021-11-20 ENCOUNTER — Encounter (HOSPITAL_COMMUNITY)
Admission: RE | Admit: 2021-11-20 | Discharge: 2021-11-20 | Disposition: A | Discharge status: Home / Self Care | Payer: Medicaid Other | Source: Ambulatory Visit | Attending: Surgery | Admitting: Surgery

## 2021-11-20 ENCOUNTER — Other Ambulatory Visit: Payer: Self-pay

## 2021-11-20 VITALS — BP 101/60 | HR 95 | Temp 97.8°F | Resp 18 | Ht 65.0 in | Wt 214.2 lb

## 2021-11-20 DIAGNOSIS — K279 Peptic ulcer, site unspecified, unspecified as acute or chronic, without hemorrhage or perforation: Secondary | ICD-10-CM | POA: Diagnosis not present

## 2021-11-20 DIAGNOSIS — Z01812 Encounter for preprocedural laboratory examination: Secondary | ICD-10-CM | POA: Diagnosis present

## 2021-11-20 DIAGNOSIS — Z01818 Encounter for other preprocedural examination: Secondary | ICD-10-CM

## 2021-11-20 HISTORY — DX: Unspecified injury of bladder, initial encounter: S37.20XA

## 2021-11-20 HISTORY — DX: Other complications of anesthesia, initial encounter: T88.59XA

## 2021-11-20 HISTORY — DX: Personal history of urinary (tract) infections: Z87.440

## 2021-11-20 LAB — CBC
HCT: 47.6 % — ABNORMAL HIGH (ref 36.0–46.0)
Hemoglobin: 15.6 g/dL — ABNORMAL HIGH (ref 12.0–15.0)
MCH: 30.8 pg (ref 26.0–34.0)
MCHC: 32.8 g/dL (ref 30.0–36.0)
MCV: 94.1 fL (ref 80.0–100.0)
Platelets: 268 10*3/uL (ref 150–400)
RBC: 5.06 MIL/uL (ref 3.87–5.11)
RDW: 13 % (ref 11.5–15.5)
WBC: 11.3 10*3/uL — ABNORMAL HIGH (ref 4.0–10.5)
nRBC: 0 % (ref 0.0–0.2)

## 2021-11-20 LAB — TYPE AND SCREEN
ABO/RH(D): A POS
Antibody Screen: NEGATIVE

## 2021-11-20 NOTE — Pre-Procedure Instructions (Signed)
Surgical Instructions    Your procedure is scheduled on November 29, 2021.  Report to [facility] Main Entrance "A" at 6:30 A.M., then check in with the Admitting office.  Call this number if you have problems the morning of surgery:  [phone]   If you have any questions prior to your surgery date call [phone]: Open Monday-Friday 8am-4pm    Remember:  Do not eat after midnight the night before your surgery  You may drink clear liquids until 5:30 AM the morning of your surgery.   Clear liquids allowed are: Water, Non-Citrus Juices (without pulp), Carbonated Beverages, Clear Tea, Black Coffee Only (NO MILK, CREAM OR POWDERED CREAMER of any kind), and Gatorade.  Patient Instructions  The night before surgery:  No food after midnight. ONLY clear liquids after midnight   The day of surgery (if you do NOT have diabetes):  Drink TWO (2) Pre-surgery Clear Ensures the night before surgery. One at dinner time and one at bedtime. Drink ONE (1) Pre-Surgery Clear Ensure by 4:30 AM the morning of surgery.  Drink in one sitting. Do not sip.  This drink was given to you during your hospital  pre-op appointment visit. Nothing else to drink after completing the  Pre-Surgery Clear Ensure.         If you have questions, please contact your surgeon's office.      Take these medicines the morning of surgery with A SIP OF WATER:  busPIRone (BUSPAR)   cetirizine (ZYRTEC)   hydrOXYzine (VISTARIL)   omeprazole (PRILOSEC)      As of today, STOP taking any Aspirin (unless otherwise instructed by your surgeon) Aleve, Naproxen, Ibuprofen, Motrin, Advil, Goody's, BC's, all herbal medications, fish oil, and all vitamins.                     Do NOT Smoke (Tobacco/Vaping) for 24 hours prior to your procedure.  If you use a CPAP at night, you may bring your mask/headgear for your overnight stay.   Contacts, glasses, piercing's, hearing aid's, dentures or partials may not be worn into surgery,  please bring cases for these belongings.    For patients admitted to the hospital, discharge time will be determined by your treatment team.   Patients discharged the day of surgery will not be allowed to drive home, and someone needs to stay with them for 24 hours.  SURGICAL WAITING ROOM VISITATION Patients having surgery or a procedure may have no more than 2 support people in the waiting area - these visitors may rotate.   Children under the age of 13 must have an adult with them who is not the patient. If the patient needs to stay at the hospital during part of their recovery, the visitor guidelines for inpatient rooms apply. Pre-op nurse will coordinate an appropriate time for 1 support person to accompany patient in pre-op.  This support person may not rotate.   Please refer to the [facility] website for the visitor guidelines for Inpatients (after your surgery is over and you are in a regular room).    Special instructions:   [facility]- Preparing For Surgery  Before surgery, you can play an important role. Because skin is not sterile, your skin needs to be as free of germs as possible. You can reduce the number of germs on your skin by washing with CHG (chlorahexidine gluconate) Soap before surgery.  CHG is an antiseptic cleaner which kills germs and bonds with the skin to continue  killing germs even after washing.    Oral Hygiene is also important to reduce your risk of infection.  Remember - BRUSH YOUR TEETH THE MORNING OF SURGERY WITH YOUR REGULAR TOOTHPASTE  Please do not use if you have an allergy to CHG or antibacterial soaps. If your skin becomes reddened/irritated stop using the CHG.  Do not shave (including legs and underarms) for at least 48 hours prior to first CHG shower. It is OK to shave your face.  Please follow these instructions carefully.   Shower the NIGHT BEFORE SURGERY and the MORNING OF SURGERY  If you chose to wash your hair, wash your hair first as usual  with your normal shampoo.  After you shampoo, rinse your hair and body thoroughly to remove the shampoo.  Use CHG Soap as you would any other liquid soap. You can apply CHG directly to the skin and wash gently with a scrungie or a clean washcloth.   Apply the CHG Soap to your body ONLY FROM THE NECK DOWN.  Do not use on open wounds or open sores. Avoid contact with your eyes, ears, mouth and genitals (private parts). Wash Face and genitals (private parts)  with your normal soap.   Wash thoroughly, paying special attention to the area where your surgery will be performed.  Thoroughly rinse your body with warm water from the neck down.  DO NOT shower/wash with your normal soap after using and rinsing off the CHG Soap.  Pat yourself dry with a CLEAN TOWEL.  Wear CLEAN PAJAMAS to bed the night before surgery  Place CLEAN SHEETS on your bed the night before your surgery  DO NOT SLEEP WITH PETS.   Day of Surgery: Take a shower with CHG soap. Do not wear jewelry or makeup Do not wear lotions, powders, perfumes, or deodorant. Do not shave 48 hours prior to surgery.  Do not bring valuables to the hospital.  [facility] is not responsible for any belongings or valuables. Do not wear nail polish, gel polish, artificial nails, or any other type of covering on natural nails (fingers and toes) If you have artificial nails or gel coating that need to be removed by a nail salon, please have this removed prior to surgery. Artificial nails or gel coating may interfere with anesthesia's ability to adequately monitor your vital signs. Wear Clean/Comfortable clothing the morning of surgery Remember to brush your teeth WITH YOUR REGULAR TOOTHPASTE.   Please read over the following fact sheets that you were given.    If you received a COVID test during your pre-op visit  it is requested that you wear a mask when out in public, stay away from anyone that may not be feeling well and notify your surgeon  if you develop symptoms. If you have been in contact with anyone that has tested positive in the last 10 days please notify you surgeon.

## 2021-11-20 NOTE — Progress Notes (Signed)
PCP - Launa Grill, NP-[facility] Cardiologist - denies  PPM/ICD - n/a  Chest x-ray - n/a EKG - n/a Stress Test - denies ECHO - denies Cardiac Cath - denies  Sleep Study - denies CPAP - denies  Last dose of GLP1 agonist-  n/a GLP1 instructions: n/a  Blood Thinner Instructions: n/a Aspirin Instructions:n/a  ERAS Protcol -Clear liquids until 0530 DOS. PRE-SURGERY Ensure or G2- (3) Ensures provided, 2 night before surgery, 1 the morning of surgery.  COVID TEST- n/a  Anesthesia review: No  Patient denies shortness of breath, fever, cough and chest pain at PAT appointment   All instructions explained to the patient, with a verbal understanding of the material. Patient agrees to go over the instructions while at home for a better understanding. Patient also instructed to self quarantine after being tested for COVID-19. The opportunity to ask questions was provided.

## 2021-11-29 ENCOUNTER — Encounter (HOSPITAL_COMMUNITY): Payer: Self-pay | Admitting: Surgery

## 2021-11-29 ENCOUNTER — Encounter (HOSPITAL_COMMUNITY)
Admission: RE | Disposition: A | Discharge status: Home / Self Care | Payer: Self-pay | Source: Home / Self Care | Attending: Surgery

## 2021-11-29 ENCOUNTER — Inpatient Hospital Stay (HOSPITAL_COMMUNITY): Payer: Medicaid Other

## 2021-11-29 ENCOUNTER — Other Ambulatory Visit: Payer: Self-pay

## 2021-11-29 ENCOUNTER — Inpatient Hospital Stay (HOSPITAL_COMMUNITY)
Admission: RE | Admit: 2021-11-29 | Discharge: 2021-12-04 | DRG: 327 | Disposition: A | Discharge status: Home / Self Care | Payer: Medicaid Other | Attending: Surgery | Admitting: Surgery

## 2021-11-29 DIAGNOSIS — Z79899 Other long term (current) drug therapy: Secondary | ICD-10-CM | POA: Diagnosis not present

## 2021-11-29 DIAGNOSIS — K259 Gastric ulcer, unspecified as acute or chronic, without hemorrhage or perforation: Secondary | ICD-10-CM | POA: Diagnosis present

## 2021-11-29 DIAGNOSIS — K219 Gastro-esophageal reflux disease without esophagitis: Secondary | ICD-10-CM | POA: Diagnosis present

## 2021-11-29 DIAGNOSIS — Z903 Acquired absence of stomach [part of]: Secondary | ICD-10-CM

## 2021-11-29 DIAGNOSIS — J9811 Atelectasis: Secondary | ICD-10-CM | POA: Diagnosis present

## 2021-11-29 DIAGNOSIS — Z885 Allergy status to narcotic agent status: Secondary | ICD-10-CM | POA: Diagnosis not present

## 2021-11-29 DIAGNOSIS — J45909 Unspecified asthma, uncomplicated: Secondary | ICD-10-CM | POA: Diagnosis present

## 2021-11-29 DIAGNOSIS — F419 Anxiety disorder, unspecified: Secondary | ICD-10-CM | POA: Diagnosis present

## 2021-11-29 DIAGNOSIS — Z87891 Personal history of nicotine dependence: Secondary | ICD-10-CM

## 2021-11-29 HISTORY — PX: PARTIAL GASTRECTOMY: SHX6003

## 2021-11-29 HISTORY — PX: GASTROJEJUNOSTOMY: SHX1697

## 2021-11-29 SURGERY — GASTRECTOMY, PARTIAL
Anesthesia: General | Site: Abdomen

## 2021-11-29 MED ORDER — HYDROMORPHONE HCL 1 MG/ML IJ SOLN
INTRAMUSCULAR | Status: AC
  Filled 2021-11-29: qty 0.5

## 2021-11-29 MED ORDER — DIPHENHYDRAMINE HCL 25 MG PO CAPS: 25.0000 mg | ORAL_CAPSULE | Freq: Four times a day (QID) | ORAL | Status: DC | PRN

## 2021-11-29 MED ORDER — GABAPENTIN 300 MG PO CAPS
ORAL_CAPSULE | ORAL | Status: AC
  Filled 2021-11-29: qty 1

## 2021-11-29 MED ORDER — DIPHENHYDRAMINE HCL 50 MG/ML IJ SOLN: 25.0000 mg | Freq: Four times a day (QID) | INTRAMUSCULAR | Status: DC | PRN

## 2021-11-29 MED ORDER — AMISULPRIDE (ANTIEMETIC) 5 MG/2ML IV SOLN
10.0000 mg | Freq: Once | INTRAVENOUS | Status: AC | PRN
  Administered 2021-11-29: 10 mg via INTRAVENOUS

## 2021-11-29 MED ORDER — HYDROXYZINE HCL 25 MG PO TABS
25.0000 mg | ORAL_TABLET | Freq: Every day | ORAL | Status: DC | PRN
  Administered 2021-11-30: 25 mg via ORAL
  Filled 2021-11-29: qty 1

## 2021-11-29 MED ORDER — CEFAZOLIN SODIUM-DEXTROSE 2-4 GM/100ML-% IV SOLN
INTRAVENOUS | Status: AC
  Filled 2021-11-29: qty 100

## 2021-11-29 MED ORDER — ACETAMINOPHEN 500 MG PO TABS
1000.0000 mg | ORAL_TABLET | Freq: Three times a day (TID) | ORAL | Status: DC
  Administered 2021-11-29 – 2021-12-04 (×12): 1000 mg via ORAL
  Filled 2021-11-29 (×15): qty 2

## 2021-11-29 MED ORDER — PROPOFOL 10 MG/ML IV BOLUS
INTRAVENOUS | Status: AC
  Filled 2021-11-29: qty 20

## 2021-11-29 MED ORDER — SUCCINYLCHOLINE CHLORIDE 200 MG/10ML IV SOSY
PREFILLED_SYRINGE | INTRAVENOUS | Status: DC | PRN
  Administered 2021-11-29: 140 mg via INTRAVENOUS

## 2021-11-29 MED ORDER — CHLORHEXIDINE GLUCONATE CLOTH 2 % EX PADS: 6.0000 | MEDICATED_PAD | Freq: Once | CUTANEOUS | Status: DC

## 2021-11-29 MED ORDER — ACETAMINOPHEN 500 MG PO TABS
ORAL_TABLET | ORAL | Status: AC
  Administered 2021-11-29: 1000 mg via ORAL
  Filled 2021-11-29: qty 2

## 2021-11-29 MED ORDER — MIDAZOLAM HCL 2 MG/2ML IJ SOLN
INTRAMUSCULAR | Status: DC | PRN
  Administered 2021-11-29: 2 mg via INTRAVENOUS

## 2021-11-29 MED ORDER — OXYCODONE HCL 5 MG PO TABS
5.0000 mg | ORAL_TABLET | ORAL | Status: DC | PRN
  Administered 2021-11-30 – 2021-12-01 (×3): 10 mg via ORAL
  Administered 2021-12-01: 5 mg via ORAL
  Administered 2021-12-01 – 2021-12-03 (×6): 10 mg via ORAL
  Filled 2021-11-29 (×9): qty 2
  Filled 2021-11-29: qty 1

## 2021-11-29 MED ORDER — METRONIDAZOLE 500 MG/100ML IV SOLN
INTRAVENOUS | Status: AC
  Filled 2021-11-29: qty 100

## 2021-11-29 MED ORDER — AMISULPRIDE (ANTIEMETIC) 5 MG/2ML IV SOLN
INTRAVENOUS | Status: AC
  Filled 2021-11-29: qty 4

## 2021-11-29 MED ORDER — SUGAMMADEX SODIUM 200 MG/2ML IV SOLN
INTRAVENOUS | Status: DC | PRN
  Administered 2021-11-29: 200 mg via INTRAVENOUS

## 2021-11-29 MED ORDER — CEFAZOLIN SODIUM-DEXTROSE 2-4 GM/100ML-% IV SOLN
2.0000 g | INTRAVENOUS | Status: AC
  Administered 2021-11-29: 2 g via INTRAVENOUS

## 2021-11-29 MED ORDER — SODIUM CHLORIDE (PF) 0.9 % IJ SOLN
INTRAMUSCULAR | Status: DC | PRN
  Administered 2021-11-29: 40 mL

## 2021-11-29 MED ORDER — PROMETHAZINE HCL 25 MG/ML IJ SOLN: 6.2500 mg | INTRAMUSCULAR | Status: DC | PRN

## 2021-11-29 MED ORDER — 0.9 % SODIUM CHLORIDE (POUR BTL) OPTIME
TOPICAL | Status: DC | PRN
  Administered 2021-11-29 (×3): 1000 mL

## 2021-11-29 MED ORDER — PHENYLEPHRINE 80 MCG/ML (10ML) SYRINGE FOR IV PUSH (FOR BLOOD PRESSURE SUPPORT)
PREFILLED_SYRINGE | INTRAVENOUS | Status: DC | PRN
  Administered 2021-11-29: 80 ug via INTRAVENOUS
  Administered 2021-11-29: 160 ug via INTRAVENOUS

## 2021-11-29 MED ORDER — HYDROMORPHONE HCL 1 MG/ML IJ SOLN
INTRAMUSCULAR | Status: DC | PRN
  Administered 2021-11-29: .5 mg via INTRAVENOUS

## 2021-11-29 MED ORDER — OXYCODONE HCL 5 MG PO TABS: 5.0000 mg | ORAL_TABLET | Freq: Once | ORAL | Status: DC | PRN

## 2021-11-29 MED ORDER — FENTANYL CITRATE (PF) 250 MCG/5ML IJ SOLN
INTRAMUSCULAR | Status: AC
  Filled 2021-11-29: qty 5

## 2021-11-29 MED ORDER — ONDANSETRON HCL 4 MG/2ML IJ SOLN
INTRAMUSCULAR | Status: DC | PRN
  Administered 2021-11-29: 4 mg via INTRAVENOUS

## 2021-11-29 MED ORDER — ENSURE PRE-SURGERY PO LIQD: 296.0000 mL | Freq: Once | ORAL | Status: DC

## 2021-11-29 MED ORDER — HYDROXYZINE PAMOATE 25 MG PO CAPS: 25.0000 mg | ORAL_CAPSULE | Freq: Every day | ORAL | Status: DC | PRN

## 2021-11-29 MED ORDER — MELATONIN 3 MG PO TABS: 3.0000 mg | ORAL_TABLET | Freq: Every evening | ORAL | Status: DC | PRN

## 2021-11-29 MED ORDER — LIDOCAINE 2% (20 MG/ML) 5 ML SYRINGE
INTRAMUSCULAR | Status: DC | PRN
  Administered 2021-11-29: 100 mg via INTRAVENOUS

## 2021-11-29 MED ORDER — MEPERIDINE HCL 25 MG/ML IJ SOLN: 6.2500 mg | INTRAMUSCULAR | Status: DC | PRN

## 2021-11-29 MED ORDER — HYDROMORPHONE HCL 1 MG/ML IJ SOLN
0.5000 mg | INTRAMUSCULAR | Status: DC | PRN
  Administered 2021-11-30: 0.5 mg via INTRAVENOUS
  Filled 2021-11-29: qty 0.5

## 2021-11-29 MED ORDER — MIDAZOLAM HCL 2 MG/2ML IJ SOLN
INTRAMUSCULAR | Status: AC
  Filled 2021-11-29: qty 2

## 2021-11-29 MED ORDER — BUPIVACAINE LIPOSOME 1.3 % IJ SUSP
INTRAMUSCULAR | Status: AC
  Filled 2021-11-29: qty 20

## 2021-11-29 MED ORDER — CHLORHEXIDINE GLUCONATE 0.12 % MT SOLN
15.0000 mL | Freq: Once | OROMUCOSAL | Status: AC
  Administered 2021-11-29: 15 mL via OROMUCOSAL
  Filled 2021-11-29: qty 15

## 2021-11-29 MED ORDER — ENOXAPARIN SODIUM 40 MG/0.4ML IJ SOSY
40.0000 mg | PREFILLED_SYRINGE | INTRAMUSCULAR | Status: DC
  Administered 2021-11-30 – 2021-12-04 (×5): 40 mg via SUBCUTANEOUS
  Filled 2021-11-29 (×5): qty 0.4

## 2021-11-29 MED ORDER — ORAL CARE MOUTH RINSE: 15.0000 mL | Freq: Once | OROMUCOSAL | Status: AC

## 2021-11-29 MED ORDER — LACTATED RINGERS IV SOLN: INTRAVENOUS | Status: DC

## 2021-11-29 MED ORDER — BUSPIRONE HCL 15 MG PO TABS
15.0000 mg | ORAL_TABLET | Freq: Three times a day (TID) | ORAL | Status: DC
  Administered 2021-11-29 – 2021-12-04 (×8): 15 mg via ORAL
  Filled 2021-11-29 (×13): qty 1

## 2021-11-29 MED ORDER — GABAPENTIN 300 MG PO CAPS: 300.0000 mg | ORAL_CAPSULE | ORAL | Status: AC

## 2021-11-29 MED ORDER — HYDROMORPHONE HCL 1 MG/ML IJ SOLN: 0.2500 mg | INTRAMUSCULAR | Status: DC | PRN

## 2021-11-29 MED ORDER — GABAPENTIN 300 MG PO CAPS
ORAL_CAPSULE | ORAL | Status: AC
  Administered 2021-11-29: 300 mg via ORAL
  Filled 2021-11-29: qty 1

## 2021-11-29 MED ORDER — METRONIDAZOLE 500 MG/100ML IV SOLN
500.0000 mg | INTRAVENOUS | Status: AC
  Administered 2021-11-29: 500 mg via INTRAVENOUS

## 2021-11-29 MED ORDER — FENTANYL CITRATE (PF) 250 MCG/5ML IJ SOLN
INTRAMUSCULAR | Status: DC | PRN
  Administered 2021-11-29: 50 ug via INTRAVENOUS
  Administered 2021-11-29: 150 ug via INTRAVENOUS
  Administered 2021-11-29: 50 ug via INTRAVENOUS

## 2021-11-29 MED ORDER — ROCURONIUM BROMIDE 10 MG/ML (PF) SYRINGE
PREFILLED_SYRINGE | INTRAVENOUS | Status: DC | PRN
  Administered 2021-11-29: 50 mg via INTRAVENOUS
  Administered 2021-11-29: 20 mg via INTRAVENOUS

## 2021-11-29 MED ORDER — DEXAMETHASONE SODIUM PHOSPHATE 10 MG/ML IJ SOLN
INTRAMUSCULAR | Status: DC | PRN
  Administered 2021-11-29: 10 mg via INTRAVENOUS

## 2021-11-29 MED ORDER — ONDANSETRON HCL 4 MG/2ML IJ SOLN
4.0000 mg | Freq: Four times a day (QID) | INTRAMUSCULAR | Status: DC | PRN
  Administered 2021-11-29 – 2021-11-30 (×3): 4 mg via INTRAVENOUS
  Filled 2021-11-29 (×3): qty 2

## 2021-11-29 MED ORDER — STERILE WATER FOR IRRIGATION IR SOLN
Status: DC | PRN
  Administered 2021-11-29: 1000 mL

## 2021-11-29 MED ORDER — LACTATED RINGERS IV SOLN: INTRAVENOUS | Status: DC | PRN

## 2021-11-29 MED ORDER — OXYCODONE HCL 5 MG/5ML PO SOLN: 5.0000 mg | Freq: Once | ORAL | Status: DC | PRN

## 2021-11-29 MED ORDER — DOCUSATE SODIUM 100 MG PO CAPS
100.0000 mg | ORAL_CAPSULE | Freq: Two times a day (BID) | ORAL | Status: DC
  Administered 2021-11-29 – 2021-12-04 (×10): 100 mg via ORAL
  Filled 2021-11-29 (×12): qty 1

## 2021-11-29 MED ORDER — PROPOFOL 10 MG/ML IV BOLUS
INTRAVENOUS | Status: DC | PRN
  Administered 2021-11-29: 200 mg via INTRAVENOUS

## 2021-11-29 MED ORDER — ACETAMINOPHEN 500 MG PO TABS: 1000.0000 mg | ORAL_TABLET | ORAL | Status: AC

## 2021-11-29 MED ORDER — METHOCARBAMOL 1000 MG/10ML IJ SOLN
1000.0000 mg | Freq: Three times a day (TID) | INTRAVENOUS | Status: DC
  Administered 2021-11-29 – 2021-12-01 (×4): 1000 mg via INTRAVENOUS
  Filled 2021-11-29 (×6): qty 10

## 2021-11-29 MED ORDER — ALBUMIN HUMAN 5 % IV SOLN: INTRAVENOUS | Status: DC | PRN

## 2021-11-29 MED ORDER — PANTOPRAZOLE SODIUM 40 MG IV SOLR
40.0000 mg | Freq: Two times a day (BID) | INTRAVENOUS | Status: DC
  Administered 2021-11-29 – 2021-12-02 (×6): 40 mg via INTRAVENOUS
  Filled 2021-11-29 (×8): qty 10

## 2021-11-29 MED ORDER — ENSURE PRE-SURGERY PO LIQD
592.0000 mL | Freq: Once | ORAL | Status: AC
  Administered 2021-11-29: 592 mL via ORAL

## 2021-11-29 SURGICAL SUPPLY — 71 items
ADH SKN CLS APL DERMABOND .7 (GAUZE/BANDAGES/DRESSINGS) ×2
APL PRP STRL LF DISP 70% ISPRP (MISCELLANEOUS) ×2
BAG COUNTER SPONGE SURGICOUNT (BAG) ×2 IMPLANT
BAG SPNG CNTER NS LX DISP (BAG) ×2
BIOPATCH RED 1 DISK 7.0 (GAUZE/BANDAGES/DRESSINGS) ×1 IMPLANT
BLADE CLIPPER SURG (BLADE) IMPLANT
CANISTER SUCT 3000ML PPV (MISCELLANEOUS) ×2 IMPLANT
CHLORAPREP W/TINT 26 (MISCELLANEOUS) ×2 IMPLANT
CLIP VESOCCLUDE SM WIDE 24/CT (CLIP) ×2 IMPLANT
CONT SPEC 4OZ CLIKSEAL STRL BL (MISCELLANEOUS) ×2 IMPLANT
DERMABOND ADVANCED .7 DNX12 (GAUZE/BANDAGES/DRESSINGS) ×1 IMPLANT
DRAIN CHANNEL 19F RND (DRAIN) ×1 IMPLANT
DRAIN PENROSE 0.5X18 (DRAIN) IMPLANT
DRAPE INCISE IOBAN 66X45 STRL (DRAPES) ×2 IMPLANT
DRAPE LAPAROSCOPIC ABDOMINAL (DRAPES) ×2 IMPLANT
DRAPE WARM FLUID 44X44 (DRAPES) ×2 IMPLANT
DRSG TEGADERM 4X4.75 (GAUZE/BANDAGES/DRESSINGS) ×1 IMPLANT
ELECT BLADE 6.5 EXT (BLADE) ×2 IMPLANT
ELECT PAD DSPR THERM+ ADLT (MISCELLANEOUS) ×2 IMPLANT
ELECT REM PT RETURN 9FT ADLT (ELECTROSURGICAL) ×2
ELECTRODE REM PT RTRN 9FT ADLT (ELECTROSURGICAL) ×2 IMPLANT
EVACUATOR SILICONE 100CC (DRAIN) ×1 IMPLANT
GAUZE 4X4 16PLY ~~LOC~~+RFID DBL (SPONGE) ×1 IMPLANT
GAUZE SPONGE 2X2 8PLY STRL LF (GAUZE/BANDAGES/DRESSINGS) ×1 IMPLANT
GAUZE SPONGE 4X4 12PLY STRL (GAUZE/BANDAGES/DRESSINGS) ×1 IMPLANT
GLOVE SURG POLY MICRO LF SZ5.5 (GLOVE) ×2 IMPLANT
GLOVE SURG UNDER POLY LF SZ6 (GLOVE) ×2 IMPLANT
GOWN STRL REUS W/ TWL LRG LVL3 (GOWN DISPOSABLE) ×4 IMPLANT
GOWN STRL REUS W/TWL LRG LVL3 (GOWN DISPOSABLE) ×4
HAND PENCIL TRP OPTION (MISCELLANEOUS) ×2 IMPLANT
HANDLE SUCTION POOLE (INSTRUMENTS) ×2 IMPLANT
KIT BASIN OR (CUSTOM PROCEDURE TRAY) ×2 IMPLANT
KIT TUBE JEJUNAL 16FR (CATHETERS) IMPLANT
KIT TURNOVER KIT B (KITS) ×2 IMPLANT
LIGASURE IMPACT 36 18CM CVD LR (INSTRUMENTS) ×1 IMPLANT
NEEDLE HYPO 22GX1.5 SAFETY (NEEDLE) ×1 IMPLANT
NS IRRIG 1000ML POUR BTL (IV SOLUTION) ×4 IMPLANT
PACK GENERAL/GYN (CUSTOM PROCEDURE TRAY) ×2 IMPLANT
PAD ARMBOARD 7.5X6 YLW CONV (MISCELLANEOUS) ×2 IMPLANT
RELOAD PROXIMATE 75MM BLUE (ENDOMECHANICALS) ×4 IMPLANT
RELOAD PROXIMATE 75MM GREEN (ENDOMECHANICALS) ×4 IMPLANT
RELOAD PROXIMATE TA60MM BLUE (ENDOMECHANICALS) ×2 IMPLANT
RELOAD STAPLE 60 BLU REG PROX (ENDOMECHANICALS) IMPLANT
RELOAD STAPLE 75 3.8 BLU REG (ENDOMECHANICALS) IMPLANT
RELOAD STAPLE 75 4.5 GRN THCK (ENDOMECHANICALS) IMPLANT
RETRACTOR WOUND ALXS 34CM XLRG (MISCELLANEOUS) ×2 IMPLANT
RTRCTR WOUND ALEXIS 34CM XLRG (MISCELLANEOUS) ×2
SLEEVE SUCTION CATH 165 (SLEEVE) ×2 IMPLANT
SPONGE T-LAP 18X18 ~~LOC~~+RFID (SPONGE) ×3 IMPLANT
STAPLER GUN LINEAR PROX 60 (STAPLE) ×1 IMPLANT
STAPLER PROXIMATE 75MM BLUE (STAPLE) ×1 IMPLANT
STAPLER VISISTAT 35W (STAPLE) ×1 IMPLANT
SUCTION POOLE HANDLE (INSTRUMENTS) ×2
SUT ETHILON 2 0 FS 18 (SUTURE) ×1 IMPLANT
SUT MNCRL AB 4-0 PS2 18 (SUTURE) ×2 IMPLANT
SUT PDS AB 1 TP1 96 (SUTURE) ×4 IMPLANT
SUT PDS AB 3-0 SH 27 (SUTURE) IMPLANT
SUT PROLENE 4 0 RB 1 (SUTURE) ×2
SUT PROLENE 4-0 RB1 .5 CRCL 36 (SUTURE) ×2 IMPLANT
SUT SILK 2 0 TIES 10X30 (SUTURE) ×2 IMPLANT
SUT SILK 2 0SH CR/8 30 (SUTURE) ×2 IMPLANT
SUT SILK 3 0 TIES 10X30 (SUTURE) ×2 IMPLANT
SUT SILK 3 0SH CR/8 30 (SUTURE) ×3 IMPLANT
SUT VIC AB 3-0 MH 27 (SUTURE) ×2 IMPLANT
SUT VIC AB 3-0 SH 27 (SUTURE) ×4
SUT VIC AB 3-0 SH 27X BRD (SUTURE) ×4 IMPLANT
SYR 20ML LL LF (SYRINGE) ×2 IMPLANT
TOWEL GREEN STERILE (TOWEL DISPOSABLE) ×2 IMPLANT
TOWEL GREEN STERILE FF (TOWEL DISPOSABLE) ×2 IMPLANT
TRAY FOLEY MTR SLVR 16FR STAT (SET/KITS/TRAYS/PACK) ×2 IMPLANT
YANKAUER SUCT BULB TIP NO VENT (SUCTIONS) IMPLANT

## 2021-11-29 NOTE — Care Management (Signed)
  Transition of Care (TOC) Screening Note   Patient Details  Name: Angela Kent Date of Birth: Sep 03, 1985   Transition of Care [facility]) CM/SW Contact:    Carles Collet, RN Phone Number: 11/29/2021, 4:09 PM    Transition of Care Department [facility]) has reviewed patient and no TOC needs have been identified at this time. We will continue to monitor patient advancement through interdisciplinary progression rounds. If new patient transition needs arise, please place a TOC consult.

## 2021-11-29 NOTE — Anesthesia Procedure Notes (Signed)
Procedure Name: Intubation Date/Time: 11/29/2021 8:37 AM  Performed by: Darletta Moll, CRNAPre-anesthesia Checklist: Patient identified, Emergency Drugs available, Suction available and Patient being monitored Patient Re-evaluated:Patient Re-evaluated prior to induction Oxygen Delivery Method: Circle system utilized Preoxygenation: Pre-oxygenation with 100% oxygen Induction Type: IV induction Ventilation: Mask ventilation without difficulty Laryngoscope Size: Mac and 3 Grade View: Grade I Tube type: Oral Tube size: 7.0 mm Number of attempts: 1 Airway Equipment and Method: Stylet and Oral airway Placement Confirmation: ETT inserted through vocal cords under direct vision, positive ETCO2 and breath sounds checked- equal and bilateral Secured at: 22 cm Tube secured with: Tape Dental Injury: Teeth and Oropharynx as per pre-operative assessment  Comments: SRNA K Venrick

## 2021-11-29 NOTE — Op Note (Signed)
Date: 11/29/21  Patient: Angela Kent MRN: [id number]  Preoperative Diagnosis: Refractory gastric ulcers Postoperative Diagnosis: Same  Procedure: Open distal gastrectomy (antrectomy) with roux-en-Y gastrojejunostomy  Surgeon: Sophronia Simas, MD First Assistant: Luretha Murphy, MD Second Assistant: Jenna Luo, MD (Resident)  EBL: 50 mL  Anesthesia: General endotracheal  Specimens: Distal gastrectomy  Indications: Angela Kent is a 36 yo female who has had persistent gastric ulcers for about 2 years. She has ceased smoking for the last year and denies NSAID use for the last 2 years. She has been compliant with high-dose PPI therapy on multiple regimens and has been treated for H pylori. Serum gastrin was normal. She has had multiple EGDs documenting persistent ulcers in the antrum in spite of the above treatment measures, and was referred by her gastroenterologist for surgical management. She has continued to have abdominal pain. After a discussion of the risks and benefits of surgery, she agreed to proceed with antrectomy.  Findings: Grossly normal appearance of the stomach, no fibrosis or thickening of the pyloric channel. Antrectomy performed, with creation of an antecolic roux-en-Y gastrojejunostomy with 60cm roux limb.  Procedure details: Informed consent was obtained in the preoperative area prior to the procedure. The patient was brought to the operating room and placed on the table in the supine position. General anesthesia was induced and appropriate lines and drains were placed for intraoperative monitoring. Perioperative antibiotics were administered per SCIP guidelines. The abdomen was prepped and draped in the usual sterile fashion. A pre-procedure timeout was taken verifying patient identity, surgical site and procedure to be performed.  An upper midline skin incision was performed and the subcutaneous tissue was divided with cautery to expose the fascia. The fascia was opened along  the linea alba. The falciform ligament was taken down off the abdominal wall and ligated with Ligasure. An Allexis wound protector and Bookwalter fixed retractor were placed. The omentum was adherent to the patient's previous lower midline incision, and was separated from the abdominal wall using blunt dissection and cautery. Omentum was separated from the gallbladder fossa using blunt dissection. There was some oozing in the gallbladder fossa which was controlled with argon. The distal stomach was palpated. The pylorus felt grossly normal, and there was no fibrosis or thickening palpable in the antrum. The gastrocolic omentum was opened with Ligasure along the antrum to enter the lesser sac. The pars flaccid was opened with cautery. The right gastric artery was circumferentially dissected out and ligated with 2-0 silk ties near the stomach. The proximal duodenum was then circumferentially dissected out, just distal to the pylorus. The proximal duodenum was transected with a TA-60b stapler just distal to the pylorus. The lesser omentum was divided along the lesser curve of the stomach starting at the antrum and working toward the incisura using Ligasure. The greater omentum was separated from the distal greater curve with Ligasure. The OG tube was retracted into the proximal stomach. The stomach was then divided at the incisura using two green loads of a GIA 66mm stapler. The specimen was passed off the field and sent for routine pathology.  The decision was made to perform a roux-en-Y reconstruction to minimize biliary reflux. As the patient has been tolerating and compliant with PPI therapy, we also elected not to perform a truncal vagotomy, to minimize the patient's risk of delayed gastric emptying. The ligament of Treitz was identified and a mesenteric window was created on the jejunum approximately 30cm distal to the ligament. The jejunum was divided at this  point using a 74mmg GIA stapler with a blue load.  Approximately 70cm distal to this, a segment of jejunum was selected to create a jejunojejunostomy. Enterotomies were made on each segment of jejunum, and a side-to-side JJ was created using a 79mm GIA stapler with a blue load. The common enterotomy was closed with a TA-60b stapler. The anastomosis was palpated and was widely patent. The mesenteric defect was closed with 3-0 silk figure-of-eight sutures. The roux limb was then brought up to the stomach in antecolic fashion, taking care not to twist the mesentery. A gastrotomy was created on the posterior wall of the stomach near the staple line, and a jejunotomy was created on the antimesenteric border. A side-to-side gastrojejunostomy was created using a 81mm GI stapler with a blue load. The common defect was closed with an inner layer of running 3-0 Vicryl suture and an outer layer of 3-0 silk Lembert sutures. The anastomosis was palpated and was widely patent.  The duodenal stump was examined and there was some bleeding along the staple line. This was oversewn with 3-0 silk sutures. The falciform ligament was mobilized and laid over the duodenal stump, and very loosely secured by placing 3-0 silk sutures through the seromuscular layer of the duodenum. The abdomen was irrigated with warmed saline and appeared hemostatic. A 19-Fr JP drain was placed adjacent to the duodenal stump and posterior to the GJ anastomosis, and brought out through the RUQ abdominal wall. It was secured to the skin with 2-0 Nylon suture. The retractors and wound protector were removed. The fascia was closed with running looped 1 PDS suture. Scarpa's layer was closed with running 3-0 Vicryl suture and the skin was closed with running subcuticular 4-0 monocryl suture. Dermabond was applied.  The patient tolerated the procedure well with no apparent complications. All counts were correct x2 at the end of the procedure. The patient was extubated and taken to PACU in stable  condition.  Michaelle Birks, MD 11/29/21 12:38 PM

## 2021-11-29 NOTE — Transfer of Care (Signed)
Immediate Anesthesia Transfer of Care Note  Patient: Angela Kent  Procedure(s) Performed: OPEN DISTAL GASTRECTOMY (Abdomen) ROUX-EN-Y GASTROJEJUNOSTOMY (Abdomen)  Patient Location: PACU  Anesthesia Type:General  Level of Consciousness: drowsy and patient cooperative  Airway & Oxygen Therapy: Patient Spontanous Breathing and Patient connected to nasal cannula oxygen  Post-op Assessment: Report given to RN, Post -op Vital signs reviewed and stable and Patient moving all extremities X 4  Post vital signs: Reviewed and stable  Last Vitals:  Vitals Value Taken Time  BP 113/73 11/29/21 1117  Temp    Pulse 108 11/29/21 1119  Resp 30 11/29/21 1119  SpO2 94 % 11/29/21 1119  Vitals shown include unvalidated device data.  Last Pain:  Vitals:   11/29/21 0716  TempSrc:   PainSc: 5          Complications: No notable events documented.

## 2021-11-29 NOTE — H&P (Signed)
Angela Kent 24-Mar-1985  [id number].     HPI:  Angela Kent is a 36 y.o. female who was referred to me for evaluation of refractory gastric ulcers. She has been followed by GI in [place] (Dr. Marguerita Merles) for the last approximately 2 years. During that time, she has had 4 EGDs showing recurrent ulcers in the antrum of the stomach. She was previously treated for H pylori in 2020 with triple therapy, but was not tested for cure off PPI because she cannot tolerate PPI cessation long enough for testing. She quit smoking in November 2022 and has stopped using NSAIDs. She has also been on high dose PPI therapy with multiple regimens. In February of this year she was empirically treated for H pylori with quadruple therapy (all biopsies have been negative for H pylori). Her most recent EGD on 07/11/21 showed a 70mm cratered ulcer in the antrum of the stomach. Biopsies were benign and negative for H pylori. Serum gastrin (with PPI cessation) was normal in August.  She continues to have significant pain in the upper abdomen which radiates to the back. She has it every day and says it gets worse after eating. She currently takes Prilosec 40mg  BID and says she has been taking it very consistently for 2 years. She has not smoked or used any nicotine products for a year. She also says she has not used any NSAIDs in about 2 years.   She has no known personal or family history of endocrine tumors. Prior abdominal surgeries include cholecystectomy, appendectomy, and hysterectomy.   ROS: Review of Systems  Constitutional:  Negative for chills and fever.  Respiratory:  Negative for cough.   Cardiovascular:  Negative for chest pain.  Gastrointestinal:  Positive for abdominal pain.    History reviewed. No pertinent family history.  Past Medical History:  Diagnosis Date   Acid reflux    Anxiety    Asthma    Bladder injury    bladder perferated during hysterectomy surgery. Partial bladder removal.    Complication of anesthesia    slow to wake up   Depression    History of bladder infections    progressed to kidney infections.   Stomach ulcer     Past Surgical History:  Procedure Laterality Date   APPENDECTOMY     BIOPSY  09/25/2018   Procedure: BIOPSY;  Surgeon: 09/27/2018, MD;  [facility];  Service: Endoscopy;;   BIOPSY  02/23/2020   Procedure: BIOPSY;  Surgeon: 04/22/2020, MD;  [facility];  Service: Gastroenterology;;   BIOPSY  05/24/2020   Procedure: BIOPSY;  Surgeon: 07/24/2020, MD;  [facility];  Service: Gastroenterology;;   BIOPSY  08/26/2020   Procedure: BIOPSY;  Surgeon: 08/28/2020, MD;  [facility];  Service: Gastroenterology;;   BIOPSY  10/18/2020   Procedure: BIOPSY;  Surgeon: 12/18/2020, MD;  [facility];  Service: Gastroenterology;;   BIOPSY  07/11/2021   Procedure: BIOPSY;  Surgeon: 07/13/2021, MD;  [facility];  Service: Gastroenterology;;   BLADDER REPAIR  2019   at [facility], repair of bladder after hysterectomy surgery.   CESAREAN SECTION     CHOLECYSTECTOMY     ESOPHAGEAL DILATION N/A 02/23/2020   Procedure: ESOPHAGEAL DILATION;  Surgeon: 04/22/2020, MD;  [facility];  Service: Gastroenterology;  Laterality: N/A;   ESOPHAGOGASTRODUODENOSCOPY N/A 09/25/2018   Procedure: ESOPHAGOGASTRODUODENOSCOPY (EGD);  Surgeon: Rogene Houston, MD;  [facility];  Service: Endoscopy;  Laterality: N/A;  1200   ESOPHAGOGASTRODUODENOSCOPY (EGD) WITH PROPOFOL N/A 02/23/2020   Procedure: ESOPHAGOGASTRODUODENOSCOPY (EGD) WITH PROPOFOL;  Surgeon: Harvel Quale, MD;  [facility];  Service: Gastroenterology;  Laterality: N/A;  9:30   ESOPHAGOGASTRODUODENOSCOPY (EGD) WITH PROPOFOL N/A 05/24/2020   Procedure: ESOPHAGOGASTRODUODENOSCOPY (EGD) WITH PROPOFOL;  Surgeon:  Harvel Quale, MD;  [facility];  Service: Gastroenterology;  Laterality: N/A;  AM   ESOPHAGOGASTRODUODENOSCOPY (EGD) WITH PROPOFOL N/A 08/26/2020   Procedure: ESOPHAGOGASTRODUODENOSCOPY (EGD) WITH PROPOFOL;  Surgeon: Harvel Quale, MD;  [facility];  Service: Gastroenterology;  Laterality: N/A;  1:45   ESOPHAGOGASTRODUODENOSCOPY (EGD) WITH PROPOFOL N/A 10/18/2020   Procedure: ESOPHAGOGASTRODUODENOSCOPY (EGD) WITH PROPOFOL;  Surgeon: Harvel Quale, MD;  [facility];  Service: Gastroenterology;  Laterality: N/A;  12:00   ESOPHAGOGASTRODUODENOSCOPY (EGD) WITH PROPOFOL N/A 07/11/2021   Procedure: ESOPHAGOGASTRODUODENOSCOPY (EGD) WITH PROPOFOL;  Surgeon: Harvel Quale, MD;  [facility];  Service: Gastroenterology;  Laterality: N/A;  915 ASA 1   OPEN REDUCTION INTERNAL FIXATION (ORIF) SCAPHOID WITH DISTAL RADIUS GRAFT Left 04/27/2021   Procedure: OPEN REDUCTION INTERNAL FIXATION (ORIF) LEFT SCAPHOID WITH BONE GRAFT;  Surgeon: Leanora Cover, MD;  Location: [facility];  Service: Orthopedics;  Laterality: Left;   PARTIAL HYSTERECTOMY     TONSILLECTOMY      Social History:  reports that she quit smoking about a year ago. Her smoking use included cigarettes. She smoked an average of .5 packs per day. She has never used smokeless tobacco. She reports that she does not drink alcohol and does not use drugs.  Allergies:  Allergies  Allergen Reactions   Codeine Hives    Medications Prior to Admission  Medication Sig Dispense Refill   busPIRone (BUSPAR) 15 MG tablet Take 15 mg by mouth 3 (three) times daily.     cetirizine (ZYRTEC) 10 MG tablet Take 10 mg by mouth daily.     hydrOXYzine (VISTARIL) 25 MG capsule Take 25 mg by mouth daily.     naproxen (NAPROSYN) 500 MG tablet Take 500 mg by mouth 2 (two) times daily as needed for mild pain or moderate pain.     omeprazole (PRILOSEC) 40 MG capsule  Take 40 mg by mouth 2 (two) times daily.     sucralfate (CARAFATE) 1 g tablet Take 1 tablet (1 g total) by mouth 4 (four) times daily. 120 tablet 0     Physical Exam: Blood pressure 107/74, pulse 78, temperature 98.1 F (36.7 C), temperature source Oral, resp. rate 18, height 5\' 5"  (1.651 m), weight 97.5 kg. General: resting comfortably, NAD Neuro: alert and oriented, no focal deficits Resp: normal work of breathing on room air CV: RRR Abdomen: soft, nondistended, nontender to palpation. Well-healed surgical scars. Extremities: warm and well-perfused   No results found for this or any previous visit (from the past 48 hour(s)). No results found.    Assessment/Plan 36 yo female with refractory gastric ulcer disease. Proceed to OR for antrectomy with truncal vagotomy. The procedure details were reviewed with the patient including the benefits and risks, and informed consent was obtained. Admit to inpatient postoperatively.   Michaelle Birks, MD [facility] Surgery General, Hepatobiliary and Pancreatic Surgery 11/29/21 7:20 AM

## 2021-11-29 NOTE — Anesthesia Postprocedure Evaluation (Signed)
Anesthesia Post Note  Patient: Angela Kent  Procedure(s) Performed: OPEN DISTAL GASTRECTOMY (Abdomen) ROUX-EN-Y GASTROJEJUNOSTOMY (Abdomen)     Patient location during evaluation: PACU Anesthesia Type: General Level of consciousness: awake and alert Pain management: pain level controlled Vital Signs Assessment: post-procedure vital signs reviewed and stable Respiratory status: spontaneous breathing, nonlabored ventilation and respiratory function stable Cardiovascular status: blood pressure returned to baseline and stable Postop Assessment: no apparent nausea or vomiting Anesthetic complications: no   No notable events documented.  Last Vitals:  Vitals:   11/29/21 1145 11/29/21 1200  BP: 108/77 105/76  Pulse: 93 96  Resp: 17 19  Temp:    SpO2: 94% 95%    Last Pain:  Vitals:   11/29/21 1145  TempSrc:   PainSc: Asleep                 Lynda Rainwater

## 2021-11-29 NOTE — Anesthesia Preprocedure Evaluation (Signed)
Anesthesia Evaluation  Patient identified by MRN, date of birth, ID band Patient awake    Reviewed: Allergy & Precautions, H&P , NPO status , Patient's Chart, lab work & pertinent test results, reviewed documented beta blocker date and time   Airway Mallampati: II  TM Distance: >3 FB Neck ROM: full    Dental no notable dental hx.    Pulmonary asthma , former smoker,    Pulmonary exam normal breath sounds clear to auscultation       Cardiovascular Exercise Tolerance: Good negative cardio ROS   Rhythm:regular Rate:Normal     Neuro/Psych PSYCHIATRIC DISORDERS Anxiety Depression negative neurological ROS     GI/Hepatic Neg liver ROS, PUD, GERD  Medicated,  Endo/Other  negative endocrine ROS  Renal/GU negative Renal ROS  negative genitourinary   Musculoskeletal   Abdominal (+) + obese,   Peds  Hematology negative hematology ROS (+)   Anesthesia Other Findings   Reproductive/Obstetrics negative OB ROS                             Anesthesia Physical  Anesthesia Plan  ASA: 2  Anesthesia Plan: General   Post-op Pain Management: Tylenol PO (pre-op)*, Gabapentin PO (pre-op)* and Dilaudid IV   Induction: Intravenous  PONV Risk Score and Plan: 3 and Ondansetron, Dexamethasone, Midazolam and Treatment may vary due to age or medical condition  Airway Management Planned: Oral ETT  Additional Equipment:   Intra-op Plan:   Post-operative Plan: Extubation in OR  Informed Consent: I have reviewed the patients History and Physical, chart, labs and discussed the procedure including the risks, benefits and alternatives for the proposed anesthesia with the patient or authorized representative who has indicated his/her understanding and acceptance.     Dental Advisory Given  Plan Discussed with: CRNA  Anesthesia Plan Comments:         Anesthesia Quick Evaluation

## 2021-11-30 ENCOUNTER — Encounter (HOSPITAL_COMMUNITY): Payer: Self-pay | Admitting: Surgery

## 2021-11-30 LAB — BASIC METABOLIC PANEL
Anion gap: 11 (ref 5–15)
BUN: 7 mg/dL (ref 6–20)
CO2: 23 mmol/L (ref 22–32)
Calcium: 8.8 mg/dL — ABNORMAL LOW (ref 8.9–10.3)
Chloride: 105 mmol/L (ref 98–111)
Creatinine, Ser: 0.6 mg/dL (ref 0.44–1.00)
GFR, Estimated: >60 mL/min (ref >60)
Glucose, Bld: 135 mg/dL — ABNORMAL HIGH (ref 70–99)
Potassium: 4.2 mmol/L (ref 3.5–5.1)
Sodium: 139 mmol/L (ref 135–145)

## 2021-11-30 LAB — MAGNESIUM: Magnesium: 1.7 mg/dL (ref 1.7–2.4)

## 2021-11-30 LAB — CBC
HCT: 39.8 % (ref 36.0–46.0)
Hemoglobin: 13.5 g/dL (ref 12.0–15.0)
MCH: 31.3 pg (ref 26.0–34.0)
MCHC: 33.9 g/dL (ref 30.0–36.0)
MCV: 92.1 fL (ref 80.0–100.0)
Platelets: 271 10*3/uL (ref 150–400)
RBC: 4.32 MIL/uL (ref 3.87–5.11)
RDW: 12.6 % (ref 11.5–15.5)
WBC: 18.5 10*3/uL — ABNORMAL HIGH (ref 4.0–10.5)
nRBC: 0 % (ref 0.0–0.2)

## 2021-11-30 LAB — ABO/RH: ABO/RH(D): A POS

## 2021-11-30 NOTE — TOC CM/SW Note (Signed)
PT : recommending HHPT but may progress to no PT needs also may need walker.   NCM went to talk to patient. Patient asleep . Will follow up later

## 2021-11-30 NOTE — Evaluation (Signed)
Physical Therapy Evaluation Patient Details Name: Angela Kent MRN: [id number] DOB: 15-Jan-1986 Today's Date: 11/30/2021  History of Present Illness  36 yo female with refractory gastric ulcers, admitted 11/29/21 s/p antrectomy with roux-en-Y gastrojejunostomy.  Clinical Impression  Patient is s/p above surgery resulting in functional limitations due to the deficits listed below (see PT Problem List). Demonstrates slow guarded movements, requiring up to min assist for transfers but progressed well during treatment session. Min guard with ambulation using RW for light support - distance limited due to onset of nausea, requesting medication from RN - notified. Requested mobility team pick-up patient for increased mobility throughout the day as tolerated. Pt in chair comfortably at this time, call bell within reach. SpO2 94% on 2.5L, however it did drop to 87% while on room air. Patient will benefit from skilled PT to increase their independence and safety with mobility to allow discharge to the venue listed below.          Recommendations for follow up therapy are one component of a multi-disciplinary discharge planning process, led by the attending physician.  Recommendations may be updated based on patient status, additional functional criteria and insurance authorization.  Follow Up Recommendations Home health PT (May progress to no PT needed, will monitor)      Assistance Recommended at Discharge Intermittent Supervision/Assistance  Patient can return home with the following  A little help with walking and/or transfers;A little help with bathing/dressing/bathroom;Assistance with cooking/housework;Assist for transportation;Help with stairs or ramp for entrance    Equipment Recommendations Other (comment) (May need a RW if fails to progress appreciably, but anticipate she will improve.)  Recommendations for Other Services       Functional Status Assessment Patient has had a recent decline  in their functional status and demonstrates the ability to make significant improvements in function in a reasonable and predictable amount of time.     Precautions / Restrictions Precautions Precautions: Other (comment) (abdominal) Restrictions Weight Bearing Restrictions: No      Mobility  Bed Mobility Overal bed mobility: Needs Assistance Bed Mobility: Rolling, Sidelying to Sit Rolling: Min guard Sidelying to sit: Min guard       General bed mobility comments: min guard for safety. cues for technique with log roll to limit abdominal strain.    Transfers Overall transfer level: Needs assistance Equipment used: Rolling walker (2 wheels) Transfers: Sit to/from Stand Sit to Stand: Min assist           General transfer comment: min assist for boost to stand from low bed setting. Progressed to min guard with practice. Using RW upon standing for support.    Ambulation/Gait Ambulation/Gait assistance: Min guard Gait Distance (Feet): 15 Feet Assistive device: Rolling walker (2 wheels) Gait Pattern/deviations: Step-through pattern, Decreased stride length Gait velocity: decreased Gait velocity interpretation: <1.31 ft/sec, indicative of household ambulator Pre-gait activities: weight shift and march General Gait Details: Slow and guarded but stable with use of RW for support. Cues for technique. Managed lines/leads, Min guard for safety. 2.5L supplemental O2. Moderate DOE, encouraged pursed lip breathing techniques to decrease rate of breathing. SpO2 94%.  Stairs            Wheelchair Mobility    Modified Rankin (Stroke Patients Only)       Balance Overall balance assessment: Needs assistance Sitting-balance support: No upper extremity supported, Feet supported Sitting balance-Leahy Scale: Good Sitting balance - Comments: abdominal guarding   Standing balance support: No upper extremity supported, During functional activity Standing  balance-Leahy Scale:  Fair                               Pertinent Vitals/Pain Pain Assessment Pain Assessment: Faces Faces Pain Scale: Hurts little more Pain Location: abdomen Pain Descriptors / Indicators: Aching Pain Intervention(s): RN gave pain meds during session, Monitored during session    Home Living Family/patient expects to be discharged to:: Private residence Living Arrangements: Spouse/significant other;Children Available Help at Discharge: Family;Available 24 hours/day Type of Home: Mobile home Home Access: Stairs to enter Entrance Stairs-Rails: Right;Left;Can reach both Entrance Stairs-Number of Steps: 3-4   Home Layout: One level Home Equipment: None      Prior Function Prior Level of Function : Independent/Modified Independent                     Hand Dominance   Dominant Hand: Right    Extremity/Trunk Assessment   Upper Extremity Assessment Upper Extremity Assessment: Defer to OT evaluation    Lower Extremity Assessment Lower Extremity Assessment: Generalized weakness       Communication   Communication: No difficulties  Cognition Arousal/Alertness: Lethargic, Suspect due to medications Behavior During Therapy: WFL for tasks assessed/performed Overall Cognitive Status: Within Functional Limits for tasks assessed                                          General Comments General comments (skin integrity, edema, etc.): SpO2 94% on 2.5L at rest, 87% on RA.    Exercises General Exercises - Lower Extremity Ankle Circles/Pumps: AROM, Both, 10 reps, Supine   Assessment/Plan    PT Assessment Patient needs continued PT services  PT Problem List Decreased strength;Decreased activity tolerance;Decreased balance;Decreased mobility;Decreased knowledge of use of DME;Cardiopulmonary status limiting activity;Pain;Obesity       PT Treatment Interventions DME instruction;Gait training;Functional mobility training;Stair  training;Therapeutic activities;Therapeutic exercise;Balance training;Neuromuscular re-education;Patient/family education    PT Goals (Current goals can be found in the Care Plan section)  Acute Rehab PT Goals Patient Stated Goal: Get well PT Goal Formulation: With patient Time For Goal Achievement: 12/14/21 Potential to Achieve Goals: Good    Frequency Min 3X/week     Co-evaluation               AM-PAC PT "6 Clicks" Mobility  Outcome Measure Help needed turning from your back to your side while in a flat bed without using bedrails?: A Little Help needed moving from lying on your back to sitting on the side of a flat bed without using bedrails?: A Little Help needed moving to and from a bed to a chair (including a wheelchair)?: A Little Help needed standing up from a chair using your arms (e.g., wheelchair or bedside chair)?: A Little Help needed to walk in hospital room?: A Little Help needed climbing 3-5 steps with a railing? : A Lot 6 Click Score: 17    End of Session Equipment Utilized During Treatment: Gait belt;Oxygen Activity Tolerance: Other (comment) (Limited by nausea) Patient left: in chair;with call bell/phone within reach;with chair alarm set;with SCD's reapplied Nurse Communication: Other (comment);Mobility status (Requests med for nausea) PT Visit Diagnosis: Unsteadiness on feet (R26.81);Muscle weakness (generalized) (M62.81);Pain;Difficulty in walking, not elsewhere classified (R26.2) Pain - part of body:  (abdomen)    Time: 2549-8264 PT Time Calculation (min) (ACUTE ONLY): 43 min  Charges:   PT Evaluation $PT Eval Low Complexity: 1 Low PT Treatments $Gait Training: 8-22 mins $Therapeutic Activity: 8-22 mins        Candie Mile, PT, DPT Physical Therapist Acute Rehabilitation Services [place] 11/30/2021,  11:03 AM

## 2021-11-30 NOTE — Progress Notes (Signed)
    1 Day Post-Op  Subjective: No acute events overnight. Endorses abdominal pain this morning. Had nausea postop yesterday but this is resolved this morning. Labs unremarkable.   Objective: Vital signs in last 24 hours: Temp:  [97.9 F (36.6 C)-99.2 F (37.3 C)] 98.3 F (36.8 C) (10/19 0553) Pulse Rate:  [78-114] 100 (10/19 0553) Resp:  [16-30] 18 (10/19 0553) BP: (96-120)/(59-81) 96/66 (10/19 0553) SpO2:  [90 %-97 %] 95 % (10/19 0553) Weight:  [97.5 kg] 97.5 kg (10/18 0659)    Intake/Output from previous day: 10/18 0701 - 10/19 0700 In: 3845.1 [I.V.:3375.1; IV Piggyback:470] Out: 2210 [Urine:2025; Drains:85; Blood:100] Intake/Output this shift: Total I/O In: 1247.4 [I.V.:1137.4; IV Piggyback:110] Out: 2536 [Urine:1775; Drains:35]  PE: General: resting comfortably, NAD Neuro: alert and oriented, no focal deficits Resp: normal work of breathing Abdomen: soft, nondistended, appropriately tender to palpation. Midline incision clean and dry with no erythema or induration. RUQ JP with serosanguinous drainage. Extremities: warm and well-perfused GU: foley draining clear yellow urine   Lab Results:  Recent Labs    11/30/21 0242  WBC 18.5*  HGB 13.5  HCT 39.8  PLT 271   BMET Recent Labs    11/30/21 0242  NA 139  K 4.2  CL 105  CO2 23  GLUCOSE 135*  BUN 7  CREATININE 0.60  CALCIUM 8.8*   PT/INR No results for input(s): "LABPROT", "INR" in the last 72 hours. CMP     Component Value Date/Time   NA 139 11/30/2021 0242   K 4.2 11/30/2021 0242   CL 105 11/30/2021 0242   CO2 23 11/30/2021 0242   GLUCOSE 135 (H) 11/30/2021 0242   BUN 7 11/30/2021 0242   CREATININE 0.60 11/30/2021 0242   CREATININE 0.58 02/19/2019 1021   CALCIUM 8.8 (L) 11/30/2021 0242   PROT 7.1 12/25/2020 1623   ALBUMIN 3.7 12/25/2020 1623   AST 25 12/25/2020 1623   ALT 18 12/25/2020 1623   ALKPHOS 28 (L) 12/25/2020 1623   BILITOT 0.4 12/25/2020 1623   GFRNONAA >60 11/30/2021 0242    GFRNONAA 123 02/19/2019 1021   GFRAA 142 02/19/2019 1021   Lipase     Component Value Date/Time   LIPASE 17 02/19/2019 1021       Studies/Results: No results found.    Assessment/Plan 36 yo female with refractory gastric ulcers, POD1 s/p antrectomy with roux-en-Y gastrojejunostomy. - Advance to clear liquid diet - Maintenance IV fluids - Multimodal pain control: scheduled robaxin and tylenol, prn oxycodone and dilaudid. No NSAIDs. - BID PPI - Remove foley - Ambulate, PT ordered, pulmonary toilet - VTE: lovenox, SCDs - Dispo: inpatient, med-surg floor     LOS: 1 day    Michaelle Birks, MD [facility] Surgery General, Hepatobiliary and Pancreatic Surgery 11/30/21 6:55 AM

## 2021-12-01 LAB — CBC
HCT: 40.3 % (ref 36.0–46.0)
Hemoglobin: 13.2 g/dL (ref 12.0–15.0)
MCH: 30.6 pg (ref 26.0–34.0)
MCHC: 32.8 g/dL (ref 30.0–36.0)
MCV: 93.5 fL (ref 80.0–100.0)
Platelets: 213 10*3/uL (ref 150–400)
RBC: 4.31 MIL/uL (ref 3.87–5.11)
RDW: 13 % (ref 11.5–15.5)
WBC: 13.6 10*3/uL — ABNORMAL HIGH (ref 4.0–10.5)
nRBC: 0 % (ref 0.0–0.2)

## 2021-12-01 LAB — SURGICAL PATHOLOGY

## 2021-12-01 LAB — BASIC METABOLIC PANEL
Anion gap: 10 (ref 5–15)
BUN: <5 mg/dL — ABNORMAL LOW (ref 6–20)
CO2: 26 mmol/L (ref 22–32)
Calcium: 8.5 mg/dL — ABNORMAL LOW (ref 8.9–10.3)
Chloride: 103 mmol/L (ref 98–111)
Creatinine, Ser: 0.56 mg/dL (ref 0.44–1.00)
GFR, Estimated: >60 mL/min (ref >60)
Glucose, Bld: 100 mg/dL — ABNORMAL HIGH (ref 70–99)
Potassium: 3.9 mmol/L (ref 3.5–5.1)
Sodium: 139 mmol/L (ref 135–145)

## 2021-12-01 MED ORDER — METHOCARBAMOL 1000 MG/10ML IJ SOLN
500.0000 mg | Freq: Three times a day (TID) | INTRAVENOUS | Status: DC
  Administered 2021-12-01 – 2021-12-02 (×3): 500 mg via INTRAVENOUS
  Filled 2021-12-01 (×8): qty 5

## 2021-12-01 MED ORDER — HYDROXYZINE HCL 25 MG PO TABS: 25.0000 mg | ORAL_TABLET | Freq: Every day | ORAL | Status: DC | PRN

## 2021-12-01 MED ORDER — HYDROMORPHONE HCL 1 MG/ML IJ SOLN: 0.5000 mg | INTRAMUSCULAR | Status: DC | PRN

## 2021-12-01 NOTE — Progress Notes (Signed)
Physical Therapy Treatment & Discharge Patient Details Name: CHARNAY NAZARIO MRN: [id number] DOB: [birth year] Today's Date: 12/01/2021   History of Present Illness Pt is a 36 y.o. with refractory gastric ulcers, admitted 11/29/21 for same day open distal gastrectomy (antrectomy) with roux-en-Y gastrojejunostomy. PMH includes hysterectomy, asthma, depression, anxiety.   PT Comments    Pt progressing with mobility. Today's session focused on mobility without DME while incorporating abdominal precautions for comfort; pt mobilizing independently, though limited by dizziness (pt attributes to pain meds on empty stomach). Reviewed educ re: precautions, positioning, activity recommendations, incentive spirometer use. Pt has met short-term acute PT goals, reports no further questions or concerns. Will d/c acute PT; pt to be on mobility specialist caseload for accountability related to hallway ambulation.  SpO2 82% on RA SpO2 91% on 2L O2 [place] HR up to 126    Recommendations for follow up therapy are one component of a multi-disciplinary discharge planning process, led by the attending physician.  Recommendations may be updated based on patient status, additional functional criteria and insurance authorization.  Follow Up Recommendations  No PT follow up     Assistance Recommended at Discharge PRN  Patient can return home with the following Assist for transportation;Assistance with cooking/housework   Equipment Recommendations  None recommended by PT    Recommendations for Other Services  Mobility Specialist     Precautions / Restrictions Precautions Precautions: Other (comment) Precaution Comments: abdominal precautions for comfort, RLQ JP drain; watch SpO2 (does not wear baseline) Restrictions Weight Bearing Restrictions: No     Mobility  Bed Mobility Overal bed mobility: Modified Independent Bed Mobility: Supine to Sit   Sidelying to sit: Modified independent (Device/Increase time),  HOB elevated       General bed mobility comments: mod indep with use of bed rail; pt reports plan to sleep in recliner    Transfers Overall transfer level: Independent Equipment used: None Transfers: Sit to/from Stand                  Ambulation/Gait Ambulation/Gait assistance: Modified independent (Device/Increase time) Gait Distance (Feet): 20 Feet Assistive device: IV Pole Gait Pattern/deviations: Step-through pattern, Decreased stride length, Antalgic Gait velocity: Decreased     General Gait Details: slow, guarded gait, pt mod indep pushing IV pole with good awareness of line management. further mobility deferred secondary to c/o lightheadedness (pt attributes to pain meds on empty stomach)   Stairs             Wheelchair Mobility    Modified Rankin (Stroke Patients Only)       Balance Overall balance assessment: Modified Independent Sitting-balance support: No upper extremity supported, Feet supported Sitting balance-Leahy Scale: Good     Standing balance support: No upper extremity supported, During functional activity, Single extremity supported Standing balance-Leahy Scale: Good                              Cognition Arousal/Alertness: Awake/alert Behavior During Therapy: WFL for tasks assessed/performed, Flat affect Overall Cognitive Status: Within Functional Limits for tasks assessed                                          Exercises Other Exercises Other Exercises: incentive spirometer x5 (pt pulling 500 mL) - pt reports improvement from getting ~250 mL yesterday    General Comments  General comments (skin integrity, edema, etc.): SpO2 down to 82% on RA, HR up to 126; 2L O2 [place] replaced, SPO2 >/91%. reviewed abdominal precautions for comfort, activity recommendations, incentive spirometer use/frequency      Pertinent Vitals/Pain Pain Assessment Pain Assessment: Faces Faces Pain Scale: Hurts little  more Pain Location: abdomen Pain Descriptors / Indicators: Grimacing, Guarding Pain Intervention(s): Monitored during session, Repositioned, Premedicated before session    Home Living                          Prior Function            PT Goals (current goals can now be found in the care plan section) Acute Rehab PT Goals PT Goal Formulation: All assessment and education complete, DC therapy    Frequency    Min 3X/week      PT Plan Discharge plan needs to be updated;Equipment recommendations need to be updated    Co-evaluation              AM-PAC PT "6 Clicks" Mobility   Outcome Measure  Help needed turning from your back to your side while in a flat bed without using bedrails?: A Little Help needed moving from lying on your back to sitting on the side of a flat bed without using bedrails?: A Little Help needed moving to and from a bed to a chair (including a wheelchair)?: None Help needed standing up from a chair using your arms (e.g., wheelchair or bedside chair)?: None Help needed to walk in hospital room?: None Help needed climbing 3-5 steps with a railing? : A Little 6 Click Score: 21    End of Session   Activity Tolerance: Patient tolerated treatment well Patient left: in chair;with call bell/phone within reach Nurse Communication: Mobility status PT Visit Diagnosis: Unsteadiness on feet (R26.81);Muscle weakness (generalized) (M62.81);Pain;Difficulty in walking, not elsewhere classified (R26.2)     Time: 2706-2376 PT Time Calculation (min) (ACUTE ONLY): 22 min  Charges:  $Therapeutic Activity: 8-22 mins                     Mabeline Caras, PT, DPT Acute Rehabilitation Services  Personal: [facility] Office: [place] 12/01/2021, 11:45 AM

## 2021-12-01 NOTE — TOC CM/SW Note (Signed)
  Transition of Care [facility]) Screening Note   Patient Details  Name: AZALIAH CARRERO Date of Birth: 04-Jun-1985   Transition of Care [facility]) CM/SW Contact:    Marilu Favre, RN Phone Number: 12/01/2021, 12:16 PM  PT recommendations No PT follow up, no DME.   Patient did have desaturation on room air. If home oxygen needed will need ambulation oxygen saturation note and order.   Transition of Care Department [facility]) has reviewed patient and no TOC needs have been identified at this time. We will continue to monitor patient advancement through interdisciplinary progression rounds. If new patient transition needs arise, please place a TOC consult.

## 2021-12-01 NOTE — Progress Notes (Addendum)
    2 Days Post-Op  Subjective: Tachycardic in 110s, afebrile. Endorses nausea but no vomiting. Labs pending.  Objective: Vital signs in last 24 hours: Temp:  [98.4 F (36.9 C)-100 F (37.8 C)] 99.5 F (37.5 C) (10/20 0425) Pulse Rate:  [96-116] 113 (10/20 0425) Resp:  [18-19] 18 (10/20 0425) BP: (87-130)/(66-84) 113/82 (10/20 0425) SpO2:  [94 %-99 %] 95 % (10/20 0425)    Intake/Output from previous day: 10/19 0701 - 10/20 0700 In: 2266.5 [P.O.:340; I.V.:1778.1; IV Piggyback:148.4] Out: 10 [Drains:10] Intake/Output this shift: No intake/output data recorded.  PE: General: resting in bed Neuro: somnolent but awakens to verbal stimuli Resp: mildly tachypneic Abdomen: soft, nondistended, appropriately tender to palpation. Midline incision clean and dry, mild ecchymosis. RUQ JP with serosanguinous drainage.   Lab Results:  Recent Labs    11/30/21 0242  WBC 18.5*  HGB 13.5  HCT 39.8  PLT 271   BMET Recent Labs    11/30/21 0242  NA 139  K 4.2  CL 105  CO2 23  GLUCOSE 135*  BUN 7  CREATININE 0.60  CALCIUM 8.8*   PT/INR No results for input(s): "LABPROT", "INR" in the last 72 hours. CMP     Component Value Date/Time   NA 139 11/30/2021 0242   K 4.2 11/30/2021 0242   CL 105 11/30/2021 0242   CO2 23 11/30/2021 0242   GLUCOSE 135 (H) 11/30/2021 0242   BUN 7 11/30/2021 0242   CREATININE 0.60 11/30/2021 0242   CREATININE 0.58 02/19/2019 1021   CALCIUM 8.8 (L) 11/30/2021 0242   PROT 7.1 12/25/2020 1623   ALBUMIN 3.7 12/25/2020 1623   AST 25 12/25/2020 1623   ALT 18 12/25/2020 1623   ALKPHOS 28 (L) 12/25/2020 1623   BILITOT 0.4 12/25/2020 1623   GFRNONAA >60 11/30/2021 0242   GFRNONAA 123 02/19/2019 1021   GFRAA 142 02/19/2019 1021   Lipase     Component Value Date/Time   LIPASE 17 02/19/2019 1021       Studies/Results: No results found.    Assessment/Plan 36 yo female with refractory gastric ulcers, POD2 s/p antrectomy with roux-en-Y  gastrojejunostomy. - Advance to full liquids - Maintenance IV fluids - Multimodal pain control: scheduled robaxin and tylenol, prn oxycodone and dilaudid. No NSAIDs. - BID PPI - Mild tachycardia and tachypnea: Need to improve pulmonary toilet. IS this morning was very poor at about 250. Labs pending. Drain remains clear with no bile or enteric contents. - Ambulate, PT ordered, pulmonary toilet - VTE: lovenox, SCDs - Dispo: inpatient, med-surg floor     LOS: 2 days    Michaelle Birks, MD [facility] Surgery General, Hepatobiliary and Pancreatic Surgery 12/01/21 7:15 AM

## 2021-12-01 NOTE — Progress Notes (Addendum)
   11/30/21 2110  Assess: MEWS Score  Temp 99 F (37.2 C)  BP 110/66  MAP (mmHg) 79  Pulse Rate (!) 115  Resp 19  Level of Consciousness Alert  SpO2 96 %  O2 Device Nasal Cannula  O2 Flow Rate (L/min) 2 L/min  Assess: MEWS Score  MEWS Temp 0  MEWS Systolic 0  MEWS Pulse 2  MEWS RR 0  MEWS LOC 0  MEWS Score 2  MEWS Score Color Yellow  Assess: if the MEWS score is Yellow or Red  Were vital signs taken at a resting state? Yes  Focused Assessment No change from prior assessment  Does the patient meet 2 or more of the SIRS criteria? Yes  Does the patient have a confirmed or suspected source of infection? No  MEWS guidelines implemented *See Row Information* Yes  Treat  MEWS Interventions Administered prn meds/treatments  Take Vital Signs  Increase Vital Sign Frequency  Yellow: Q 2hr X 2 then Q 4hr X 2, if remains yellow, continue Q 4hrs  Escalate  MEWS: Escalate Yellow: discuss with charge nurse/RN and consider discussing with provider and RRT  Notify: Charge Nurse/RN  Name of Charge Nurse/RN Notified Joesphine Carson  Date Charge Nurse/RN Notified 11/30/21  Time Charge Nurse/RN Notified 2110  Document  Progress note created (see row info) Yes  Assess: SIRS CRITERIA  SIRS Temperature  0  SIRS Pulse 1  SIRS Respirations  0  SIRS WBC 1  SIRS Score Sum  2   Pt complain of pain and anxiety. Meds given.

## 2021-12-02 LAB — BASIC METABOLIC PANEL
Anion gap: 7 (ref 5–15)
BUN: <5 mg/dL — ABNORMAL LOW (ref 6–20)
CO2: 25 mmol/L (ref 22–32)
Calcium: 7.7 mg/dL — ABNORMAL LOW (ref 8.9–10.3)
Chloride: 103 mmol/L (ref 98–111)
Creatinine, Ser: 0.48 mg/dL (ref 0.44–1.00)
GFR, Estimated: >60 mL/min (ref >60)
Glucose, Bld: 96 mg/dL (ref 70–99)
Potassium: 3.9 mmol/L (ref 3.5–5.1)
Sodium: 135 mmol/L (ref 135–145)

## 2021-12-02 LAB — CBC
HCT: 33.4 % — ABNORMAL LOW (ref 36.0–46.0)
Hemoglobin: 11 g/dL — ABNORMAL LOW (ref 12.0–15.0)
MCH: 30.6 pg (ref 26.0–34.0)
MCHC: 32.9 g/dL (ref 30.0–36.0)
MCV: 93 fL (ref 80.0–100.0)
Platelets: 212 10*3/uL (ref 150–400)
RBC: 3.59 MIL/uL — ABNORMAL LOW (ref 3.87–5.11)
RDW: 12.8 % (ref 11.5–15.5)
WBC: 11.5 10*3/uL — ABNORMAL HIGH (ref 4.0–10.5)
nRBC: 0 % (ref 0.0–0.2)

## 2021-12-02 MED ORDER — METHOCARBAMOL 750 MG PO TABS
750.0000 mg | ORAL_TABLET | Freq: Three times a day (TID) | ORAL | Status: DC
  Administered 2021-12-02 – 2021-12-04 (×5): 750 mg via ORAL
  Filled 2021-12-02 (×5): qty 1

## 2021-12-02 MED ORDER — PANTOPRAZOLE SODIUM 40 MG PO TBEC
40.0000 mg | DELAYED_RELEASE_TABLET | Freq: Two times a day (BID) | ORAL | Status: DC
  Administered 2021-12-02 – 2021-12-04 (×4): 40 mg via ORAL
  Filled 2021-12-02 (×4): qty 1

## 2021-12-02 NOTE — Progress Notes (Signed)
A consult was placed to IV Therapy to place new iv access; pt is only receiving IV Robaxin, which she doesn't feel like she needs any more;  pt stated " I'm tired of feeling like a pin cushion; I don't want the iv." RN is aware of no access at this time;  pt also stated "if I need one, then put one in."

## 2021-12-02 NOTE — Progress Notes (Signed)
    3 Days Post-Op  Subjective: Tachycardia improved. WBC down to 11. Much more alert this morning. Tolerating full liquids but reports early satiety and intermittent nausea. Ambulated in room yesterday.  Objective: Vital signs in last 24 hours: Temp:  [98 F (36.7 C)-99.8 F (37.7 C)] 99.1 F (37.3 C) (10/21 0421) Pulse Rate:  [97-125] 105 (10/21 0421) Resp:  [16-18] 18 (10/21 0421) BP: (93-112)/(72-82) 109/82 (10/21 0421) SpO2:  [90 %-96 %] 93 % (10/21 0421) Last BM Date : 11/28/21  Intake/Output from previous day: 10/20 0701 - 10/21 0700 In: 909.7 [P.O.:220; I.V.:663.1; IV Piggyback:26.5] Out: 30 [Drains:30] Intake/Output this shift: Total I/O In: -  Out: 30 [Drains:30]  PE: General: resting in bed, NAD Neuro: alert and oriented Resp: normal work of breathing on nasal cannula Abdomen: soft, nondistended, appropriately tender to palpation. Midline incision clean and dry, mild ecchymosis. RUQ JP with serosanguinous drainage.   Lab Results:  Recent Labs    12/01/21 0711 12/02/21 0250  WBC 13.6* 11.5*  HGB 13.2 11.0*  HCT 40.3 33.4*  PLT 213 212   BMET Recent Labs    11/30/21 0242 12/01/21 0711  NA 139 139  K 4.2 3.9  CL 105 103  CO2 23 26  GLUCOSE 135* 100*  BUN 7 <5*  CREATININE 0.60 0.56  CALCIUM 8.8* 8.5*   PT/INR No results for input(s): "LABPROT", "INR" in the last 72 hours. CMP     Component Value Date/Time   NA 139 12/01/2021 0711   K 3.9 12/01/2021 0711   CL 103 12/01/2021 0711   CO2 26 12/01/2021 0711   GLUCOSE 100 (H) 12/01/2021 0711   BUN <5 (L) 12/01/2021 0711   CREATININE 0.56 12/01/2021 0711   CREATININE 0.58 02/19/2019 1021   CALCIUM 8.5 (L) 12/01/2021 0711   PROT 7.1 12/25/2020 1623   ALBUMIN 3.7 12/25/2020 1623   AST 25 12/25/2020 1623   ALT 18 12/25/2020 1623   ALKPHOS 28 (L) 12/25/2020 1623   BILITOT 0.4 12/25/2020 1623   GFRNONAA >60 12/01/2021 0711   GFRNONAA 123 02/19/2019 1021   GFRAA 142 02/19/2019 1021   Lipase      Component Value Date/Time   LIPASE 17 02/19/2019 1021       Studies/Results: No results found.    Assessment/Plan 36 yo female with refractory gastric ulcers, POD3 s/p antrectomy with roux-en-Y gastrojejunostomy. - Continue full liquids, SLIV - Multimodal pain control: scheduled robaxin and tylenol, prn oxycodone and dilaudid. No NSAIDs. - BID PPI - Ambulate, PT ordered, again emphasized pulmonary toilet and need for more frequent ambulation today. - VTE: lovenox, SCDs - Dispo: inpatient, med-surg floor     LOS: 3 days    Michaelle Birks, MD [facility] Surgery General, Hepatobiliary and Pancreatic Surgery 12/02/21 6:56 AM

## 2021-12-02 NOTE — Progress Notes (Signed)
Lost IV access. Patient refused reinsertion. She stated she's able to tolerate fluids . MD notified.

## 2021-12-03 MED ORDER — POLYETHYLENE GLYCOL 3350 17 G PO PACK
17.0000 g | PACK | Freq: Every day | ORAL | Status: DC
  Administered 2021-12-03: 17 g via ORAL
  Filled 2021-12-03 (×2): qty 1

## 2021-12-03 NOTE — Progress Notes (Signed)
    4 Days Post-Op  Subjective: Pain improving. Passing flatus. Tolerating liquids, denies nausea/vomiting and feels hungry today. Ambulated in hallway yesterday.  Objective: Vital signs in last 24 hours: Temp:  [98.1 F (36.7 C)-98.9 F (37.2 C)] 98.7 F (37.1 C) (10/22 0411) Pulse Rate:  [90-112] 97 (10/22 0411) Resp:  [16-18] 16 (10/22 0411) BP: (105-120)/(62-86) 120/86 (10/22 0411) SpO2:  [95 %-100 %] 95 % (10/22 0411) Last BM Date : 11/28/21  Intake/Output from previous day: 10/21 0701 - 10/22 0700 In: 630 [P.O.:630] Out: 25 [Drains:25] Intake/Output this shift: No intake/output data recorded.  PE: General: resting in bed, NAD Neuro: alert and oriented Resp: normal work of breathing on nasal cannula Abdomen: soft, nondistended, nontender. Midline incision clean and dry, mild ecchymosis. RUQ JP with serosanguinous drainage.   Lab Results:  Recent Labs    12/01/21 0711 12/02/21 0250  WBC 13.6* 11.5*  HGB 13.2 11.0*  HCT 40.3 33.4*  PLT 213 212   BMET Recent Labs    12/01/21 0711 12/02/21 1052  NA 139 135  K 3.9 3.9  CL 103 103  CO2 26 25  GLUCOSE 100* 96  BUN <5* <5*  CREATININE 0.56 0.48  CALCIUM 8.5* 7.7*   PT/INR No results for input(s): "LABPROT", "INR" in the last 72 hours. CMP     Component Value Date/Time   NA 135 12/02/2021 1052   K 3.9 12/02/2021 1052   CL 103 12/02/2021 1052   CO2 25 12/02/2021 1052   GLUCOSE 96 12/02/2021 1052   BUN <5 (L) 12/02/2021 1052   CREATININE 0.48 12/02/2021 1052   CREATININE 0.58 02/19/2019 1021   CALCIUM 7.7 (L) 12/02/2021 1052   PROT 7.1 12/25/2020 1623   ALBUMIN 3.7 12/25/2020 1623   AST 25 12/25/2020 1623   ALT 18 12/25/2020 1623   ALKPHOS 28 (L) 12/25/2020 1623   BILITOT 0.4 12/25/2020 1623   GFRNONAA >60 12/02/2021 1052   GFRNONAA 123 02/19/2019 1021   GFRAA 142 02/19/2019 1021   Lipase     Component Value Date/Time   LIPASE 17 02/19/2019 1021       Studies/Results: No results  found.    Assessment/Plan 36 yo female with refractory gastric ulcers, POD4 s/p antrectomy with roux-en-Y gastrojejunostomy. - Advance to soft diet - Multimodal pain control: scheduled robaxin and tylenol, prn oxycodone. - BID PPI - Ambulate TID, incentive spirometry - VTE: lovenox, SCDs - Dispo: inpatient, med-surg floor     LOS: 4 days    Michaelle Birks, MD [facility] Surgery General, Hepatobiliary and Pancreatic Surgery 12/03/21 7:09 AM

## 2021-12-04 MED ORDER — OXYCODONE HCL 5 MG PO TABS: 5.0000 mg | ORAL_TABLET | ORAL | 0 refills | Status: AC | PRN

## 2021-12-04 MED ORDER — POLYETHYLENE GLYCOL 3350 17 G PO PACK: 17.0000 g | PACK | Freq: Every day | ORAL | 0 refills | Status: DC | PRN

## 2021-12-04 MED ORDER — METHOCARBAMOL 750 MG PO TABS: 750.0000 mg | ORAL_TABLET | Freq: Three times a day (TID) | ORAL | 0 refills | Status: DC | PRN

## 2021-12-04 MED ORDER — ACETAMINOPHEN 500 MG PO TABS: 1000.0000 mg | ORAL_TABLET | Freq: Three times a day (TID) | ORAL | 0 refills | Status: AC

## 2021-12-04 MED ORDER — DOCUSATE SODIUM 100 MG PO CAPS: 100.0000 mg | ORAL_CAPSULE | Freq: Two times a day (BID) | ORAL | 0 refills | Status: AC

## 2021-12-04 NOTE — Progress Notes (Signed)
Patient is tolerating soft diet, initially had some pain after eating but this has improved. No nausea or vomiting. Had a BM. Remains on 2L oxygen but sats are 100%. JP removed on rounds this morning, oxygen may be turned off. Patient would like to go home. If sats remain >90% on room air, plan to discharge today.  Michaelle Birks, MD [facility] Surgery General, Hepatobiliary and Pancreatic Surgery 12/04/21 7:36 AM

## 2021-12-04 NOTE — Discharge Summary (Signed)
Physician Discharge Summary   Patient ID: Angela Kent [id number] 35 y.o. May 05, 1985  Admit date: 11/29/2021  Discharge date and time: 12/04/2021  Admitting Physician: Fritzi Mandes, MD   Discharge Physician: Sophronia Simas, MD  Admission Diagnoses: S/P gastrectomy [Z90.3] Gastric ulcer [K25.9]  Discharge Diagnoses: Refractory peptic ulcer disease, s/p antrectomy  Admission Condition: good  Discharged Condition: good  Indication for Admission: Angela Kent is a 36 yo female with refractory gastric antral ulcers. She has been on high dose PPI therapy and has stopped smoking and taking NSAIDs. EGD has confirmed a persistent non-healing ulcer, and she continues to have severe epigastric pain. After an extensive discussion of the risks and benefits of surgery, she agreed to proceed with antrectomy. Please see separate H&P for further details.  Hospital Course: The patient was taken to the OR on 11/29/21 for an open antrectomy with roux-en-Y gastrojejunostomy. Please see separately dictated operative note for further details of this procedure. A JP drain was left by the duodenal stump and GJ anastomosis. Postoperatively she was admitted to the med-surg floor in stable condition. POD1 she was advanced to a clear liquid diet and her foley was removed. POD2 she had mild tachycardia and oxygen requirement, thought to be secondary to atelectasis. Labs showed downtrending WBC and her abdomen remained soft. Her tachycardia slowly improved. Diet advancement was continued over the next few days. She ambulated independently. She had return of bowel function on POD4. By the morning of POD5, she was tolerating a soft diet with no nausea or vomiting, ambulating independently, and pain was controlled with oral medications. She was weaned off oxygen to room air. She was afebrile with normal vital signs. She was examined and deemed appropriate for discharge home. Her JP drain was removed prior to discharge. She was  instructed to refrain from any NSAID and/or nicotine use.  Consults: None  Significant Diagnostic Studies: Surgical pathology: A. 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 Midline incision clean and dry. Extremities: warm and well-perfused   Disposition: Discharge disposition: 01-Home or Self Care       Patient Instructions:  Allergies as of 12/04/2021       Reactions   Codeine Hives        Medication List     TAKE these medications    acetaminophen 500 MG tablet Commonly known as: TYLENOL Take 2 tablets (1,000 mg total) by mouth 3 (three) times daily.   busPIRone 15 MG tablet Commonly known as: BUSPAR Take 15 mg by mouth 3 (three) times daily.   cetirizine 10 MG tablet Commonly known as: ZYRTEC Take 10 mg by mouth daily.   docusate sodium 100 MG capsule Commonly known as: COLACE Take 1 capsule (100 mg total) by mouth 2 (two) times daily.   hydrOXYzine 25 MG capsule Commonly known as: VISTARIL Take 25 mg by mouth daily.   methocarbamol 750 MG tablet Commonly known as: ROBAXIN Take 1 tablet (750 mg total) by mouth every 8 (eight) hours as needed for muscle spasms.   omeprazole 40 MG capsule Commonly known as: PRILOSEC Take 40  mg by mouth 2 (two) times daily.   oxyCODONE 5 MG immediate release tablet Commonly known as: Oxy IR/ROXICODONE Take 1 tablet (5 mg total) by mouth every 4 (four) hours as needed for up to 5  days for severe pain.   polyethylene glycol 17 g packet Commonly known as: MIRALAX / GLYCOLAX Take 17 g by mouth daily as needed for mild constipation.   sucralfate 1 g tablet Commonly known as: Carafate Take 1 tablet (1 g total) by mouth 4 (four) times daily.       Activity: no driving while on analgesics and no heavy lifting for 6 weeks Diet:  Soft diet Wound Care: keep wound clean and dry  Follow-up with Dr. Zenia Resides on December 22, 2021.  Signed: Dwan Bolt 12/04/2021 8:57 AM

## 2021-12-04 NOTE — Plan of Care (Signed)

## 2021-12-04 NOTE — Discharge Instructions (Addendum)
[facility] DISCHARGE INSTRUCTIONS: STOMACH SURGERY  Activity No heavy lifting greater than 15 pounds for 6 weeks after surgery. Ok to shower, but do not bathe or submerge incision underwater. Do not drive while taking narcotic pain medication. Be sure to walk around your home at least 3 times daily. This will help avoid blood clots and will improve your strength.  Wound Care Your incision is covered with skin glue called Dermabond. This will peel off on its own over time. You may shower and allow warm soapy water to run over your incisions. Gently pat dry. Do not submerge your incision underwater. Monitor your incision for any new redness, tenderness, or drainage. Keep the dressing over your former drain site for 2 days. After that you may remove it.  Medications You should take Tylenol 4 times daily for pain. If you have more severe pain that is not covered by Tylenol, you may take oxycodone up to every 4 hours as needed. Please reserve this medication only for the most severe pain. Continue taking your Prilosec twice daily. Do NOT take any NSAIDs. This includes ibuprofen (Advil, Motrin), naproxen (Aleve), aspirin, and Goody powder.  Diet You may notice that you feel full early after meals, or have occasional nausea or decreased appetite. This is common following stomach surgery and will improve with time. It is better to consume small frequent meals throughout the day rather than 3 large meals. Avoid drinking a lot of carbonated beverages, as these can worsen symptoms of nausea and reflux. If you are having frequent vomiting after meals or are unable to keep down any food or drink, please call the office right away.  When to Call us: Fever greater than 100.5 New redness, drainage, or swelling at incision site Severe pain, nausea, or vomiting Excessive vomiting or inability to keep down any liquids  Follow-up You have an appointment scheduled with Dr. Zenia Resides on  December 22, 2021 at 9:20am. This will be at the [facility] office at [address], [place], [place], [place]. Please arrive at least 15 minutes prior to your scheduled appointment time.  For questions or concerns, please call the office at [phone].

## 2021-12-04 NOTE — Progress Notes (Signed)
Angela Kent to be D/C'd  per MD order.  Discussed with the patient and all questions fully answered.  VSS, Skin clean, dry and intact without evidence of skin break down, no evidence of skin tears noted.  IV catheter discontinued intact. Site without signs and symptoms of complications. Dressing and pressure applied.  An After Visit Summary was printed and given to the patient.   D/c education completed with patient/family including follow up instructions, medication list, d/c activities limitations if indicated, with other d/c instructions as indicated by MD - patient able to verbalize understanding, all questions fully answered.   Patient instructed to return to ED, call 911, or call MD for any changes in condition.   Patient to be escorted via [place], and D/C home via private auto.

## 2022-04-09 ENCOUNTER — Ambulatory Visit: Payer: Medicaid Other | Admitting: Internal Medicine

## 2022-04-09 NOTE — Progress Notes (Signed)
Pt did not show for scheduled appointment.  

## 2022-06-25 ENCOUNTER — Institutional Professional Consult (permissible substitution): Payer: Medicaid Other | Admitting: Internal Medicine

## 2022-07-12 ENCOUNTER — Encounter: Payer: Self-pay | Admitting: Internal Medicine

## 2022-07-12 ENCOUNTER — Ambulatory Visit: Payer: Medicaid Other | Admitting: Internal Medicine

## 2022-07-12 VITALS — BP 96/50 | HR 92 | Temp 97.9°F | Ht 65.0 in | Wt 215.6 lb

## 2022-07-12 DIAGNOSIS — R0602 Shortness of breath: Secondary | ICD-10-CM

## 2022-07-12 DIAGNOSIS — K2100 Gastro-esophageal reflux disease with esophagitis, without bleeding: Secondary | ICD-10-CM | POA: Diagnosis not present

## 2022-07-12 DIAGNOSIS — G4733 Obstructive sleep apnea (adult) (pediatric): Secondary | ICD-10-CM

## 2022-07-12 DIAGNOSIS — R053 Chronic cough: Secondary | ICD-10-CM

## 2022-07-12 MED ORDER — DULERA 100-5 MCG/ACT IN AERO: 2.0000 | INHALATION_SPRAY | Freq: Two times a day (BID) | RESPIRATORY_TRACT | 3 refills | Status: AC

## 2022-07-12 NOTE — Progress Notes (Signed)
Angela Kent    [id number]    08/01/1985  Primary Care Physician:Boles, Trixie Dredge, PA-C  Referring Physician: [facility] [address] [place],  [place] [address] Reason for Consultation: shortness of breath Date of Consultation: 07/12/2022  Chief complaint:   Chief Complaint  Patient presents with   Consult    Started in 2018 after hysterectomy, bladder was cut.  She ended up with pna.  She gets pna easily.  Has a cough, sometimes dry and sometimes clear/yellow mucous.  Sob for 4 months.  Started with cleaning, then started waking up at night and at rest.  Has and inhaler, using it 5-6 times per day with no relief.  Checks sats with sob, 86-89% after 10-15 minutes.  Happens 3-4 times per day.     HPI: Angela Kent is a 37 y.o. woman who presents with chronic dyspnea which is worsening over the last 4-5 months. She saw her PCP who thought it  could be asthma and put her on albuterol prn inhaler.   Episodes happen 4-5 times a day, at rest and with exertion. Shortness of breath, worse at night. Accompanied by wheezing. She has a puls-oximeter at home and when these episodes occur her puls ox goes down to 86%. She also has episodes of coughing.  She has chest pressure and upper back pain. Symptoms resolve on their own.   She also has headaches. She has reflux and is on prilosec. Cough is worse at night. She has nocturnal apneas and snoring. Sleep study has been recommended but not scheduled.  She denies post nasal drainage or seasonal allergies but does clear her throat frequently.   Never been prescribed steroids or breathing treatments.   She had post-op pneumonia after her hysterectomy and was discharged on oxygen in 2018.  She was hospitalized for pneumonia when she was 8, and a second time a few years ago.   Asthma Control Test ACT Total Score  07/12/2022 10:06 AM 6     Echocardiogram May 2024  at [facility] - essentially normal.   Social history:  Occupation:  unemployed. Has worked as a Engineer, materials.  Exposures: lives at home with three kids and husband. Husband still smokes at home.  Smoking history: 1 ppd quit in Nov 2022. 15 pack years, quit cold [place]. Passive smoke exposure in childhood.   Social History   Occupational History   Not on file  Tobacco Use   Smoking status: Former    Packs/day: 1.00    Years: 15.00    Additional pack years: 0.00    Total pack years: 15.00    Types: Cigarettes    Quit date: 12/2020    Years since quitting: 1.5   Smokeless tobacco: Never  Vaping Use   Vaping Use: Never used  Substance and Sexual Activity   Alcohol use: Never   Drug use: Never   Sexual activity: Not on file    Relevant family history:  Family History  Problem Relation Age of Onset   COPD Father    Cancer - Lung Paternal Grandfather     Past Medical History:  Diagnosis Date   Acid reflux    Anxiety    Asthma    Bladder injury    bladder perferated during hysterectomy surgery. Partial bladder removal.   Complication of anesthesia    slow to wake up   Depression    History of bladder infections    progressed  to kidney infections.   Stomach ulcer     Past Surgical History:  Procedure Laterality Date   APPENDECTOMY     BIOPSY  09/25/2018   Procedure: BIOPSY;  Surgeon: Malissa Hippo, MD;  [facility];  Service: Endoscopy;;   BIOPSY  02/23/2020   Procedure: BIOPSY;  Surgeon: Dolores Frame, MD;  [facility];  Service: Gastroenterology;;   BIOPSY  05/24/2020   Procedure: BIOPSY;  Surgeon: Dolores Frame, MD;  [facility];  Service: Gastroenterology;;   BIOPSY  08/26/2020   Procedure: BIOPSY;  Surgeon: Dolores Frame, MD;  [facility];  Service: Gastroenterology;;   BIOPSY  10/18/2020   Procedure: BIOPSY;  Surgeon: Dolores Frame, MD;  [facility];  Service: Gastroenterology;;   BIOPSY  07/11/2021    Procedure: BIOPSY;  Surgeon: Dolores Frame, MD;  [facility];  Service: Gastroenterology;;   BLADDER REPAIR  2019   at [facility], repair of bladder after hysterectomy surgery.   CESAREAN SECTION     CHOLECYSTECTOMY     ESOPHAGEAL DILATION N/A 02/23/2020   Procedure: ESOPHAGEAL DILATION;  Surgeon: Dolores Frame, MD;  [facility];  Service: Gastroenterology;  Laterality: N/A;   ESOPHAGOGASTRODUODENOSCOPY N/A 09/25/2018   Procedure: ESOPHAGOGASTRODUODENOSCOPY (EGD);  Surgeon: Malissa Hippo, MD;  [facility];  Service: Endoscopy;  Laterality: N/A;  1200   ESOPHAGOGASTRODUODENOSCOPY (EGD) WITH PROPOFOL N/A 02/23/2020   Procedure: ESOPHAGOGASTRODUODENOSCOPY (EGD) WITH PROPOFOL;  Surgeon: Dolores Frame, MD;  [facility];  Service: Gastroenterology;  Laterality: N/A;  9:30   ESOPHAGOGASTRODUODENOSCOPY (EGD) WITH PROPOFOL N/A 05/24/2020   Procedure: ESOPHAGOGASTRODUODENOSCOPY (EGD) WITH PROPOFOL;  Surgeon: Dolores Frame, MD;  [facility];  Service: Gastroenterology;  Laterality: N/A;  AM   ESOPHAGOGASTRODUODENOSCOPY (EGD) WITH PROPOFOL N/A 08/26/2020   Procedure: ESOPHAGOGASTRODUODENOSCOPY (EGD) WITH PROPOFOL;  Surgeon: Dolores Frame, MD;  [facility];  Service: Gastroenterology;  Laterality: N/A;  1:45   ESOPHAGOGASTRODUODENOSCOPY (EGD) WITH PROPOFOL N/A 10/18/2020   Procedure: ESOPHAGOGASTRODUODENOSCOPY (EGD) WITH PROPOFOL;  Surgeon: Dolores Frame, MD;  [facility];  Service: Gastroenterology;  Laterality: N/A;  12:00   ESOPHAGOGASTRODUODENOSCOPY (EGD) WITH PROPOFOL N/A 07/11/2021   Procedure: ESOPHAGOGASTRODUODENOSCOPY (EGD) WITH PROPOFOL;  Surgeon: Dolores Frame, MD;  [facility];  Service: Gastroenterology;  Laterality: N/A;  915 ASA 1   GASTROJEJUNOSTOMY N/A 11/29/2021   Procedure: ROUX-EN-Y GASTROJEJUNOSTOMY;   Surgeon: Fritzi Mandes, MD;  Location: [facility];  Service: General;  Laterality: N/A;   OPEN REDUCTION INTERNAL FIXATION (ORIF) SCAPHOID WITH DISTAL RADIUS GRAFT Left 04/27/2021   Procedure: OPEN REDUCTION INTERNAL FIXATION (ORIF) LEFT SCAPHOID WITH BONE GRAFT;  Surgeon: Betha Loa, MD;  Location: [facility];  Service: Orthopedics;  Laterality: Left;   PARTIAL GASTRECTOMY N/A 11/29/2021   Procedure: OPEN DISTAL GASTRECTOMY;  Surgeon: Fritzi Mandes, MD;  Location: [facility];  Service: General;  Laterality: N/A;   PARTIAL HYSTERECTOMY     TONSILLECTOMY       Physical Exam: Blood pressure (!) 96/50, pulse 92, temperature 97.9 F (36.6 C), temperature source Oral, height 5\' 5"  (1.651 m), weight 215 lb 9.6 oz (97.8 kg), SpO2 98 %. Gen:      No acute distress ENT:  +cobblestoning, mild nasal debris, no nasal polyps, mucus membranes moist Lungs:    No increased respiratory effort, symmetric chest wall excursion, clear to auscultation  bilaterally, no wheezes or crackles CV:         Regular rate and rhythm; no murmurs, rubs, or gallops.  No pedal edema Abd:      Obese, soft, + bowel sounds; soft, non-tender; no distension MSK: no acute synovitis of DIP or PIP joints, no mechanics hands.  Skin:      Warm and dry; no rashes Neuro: normal speech, no focal facial asymmetry Psych: alert and oriented x3, normal mood and affect   Data Reviewed/Medical Decision Making:  Independent interpretation of tests: Imaging:  Review of patient's chest xray Nov 2022 images revealed no acute process. The patient's images have been independently reviewed by me.    PFTs:  Echocardiogram May 2024 at [facility] Summary 1. Technically difficult study. 2. The left ventricle is normal in size with normal wall thickness. 3. The left ventricular systolic function is overall normal, LVEF is visually estimated at 50-55%. 4. The left atrium is mildly dilated in size. 5. The right ventricle is upper normal in  size, with normal systolic function.  Labs:  Lab Results  Component Value Date   WBC 11.5 (H) 12/02/2021   HGB 11.0 (L) 12/02/2021   HCT 33.4 (L) 12/02/2021   MCV 93.0 12/02/2021   PLT 212 12/02/2021   Lab Results  Component Value Date   NA 135 12/02/2021   K 3.9 12/02/2021   CO2 25 12/02/2021   GLUCOSE 96 12/02/2021   BUN <5 (L) 12/02/2021   CREATININE 0.48 12/02/2021   CALCIUM 7.7 (L) 12/02/2021   GFRNONAA >60 12/02/2021     Immunization status:  Immunization History  Administered Date(s) Administered   Influenza Split 02/02/2014   Tdap 02/02/2014, 01/20/2015     I reviewed prior external note(s) from ED, hospital , gen surgery  I reviewed the result(s) of the labs and imaging as noted above.   I have ordered pft, sleep test, feno  Assessment:  Chronic cough Possible asthma Snoring, excessive daytime sleepiness, concern for OSA Severe gastritis s/p gastric antrectomy with ongoing GERD  Plan/Recommendations: Full set of PFTs. Feno today attempted but she was not able to complete  Your coughing likely a combination of post nasal drainage and reflux.   Start dulera 2 puffs twice a day, gargle after use.  We will start this as a trial to see if it helps your breathing for possible asthma.   Continue PPI. Reflux modifications in diet discussed.   Resume cetirizine.   Home sleep apnea test.   We discussed disease management and progression at length today.    Return to Care: Return in about 4 weeks (around 08/09/2022) for follow up with PFT and same day appointment.  Durel Salts, MD Pulmonary and Critical Care Medicine [facility] [phone]  CC: [facility]*

## 2022-07-12 NOTE — Patient Instructions (Addendum)
Please schedule follow up scheduled with myself in 1-2 months.  If my schedule is not open yet, we will contact you with a reminder closer to that time. Please call [phone] if you haven't heard from [place] a month before.   Before your next visit I would like you to have: Full set of PFTs - see me after  Start dulera 2 puffs twice a day, gargle after use.  We will start this as a trial to see if it helps your breathing for possible asthma.   Your coughing likely a combination of post nasal drainage and reflux.   Will order home sleep apnea test.    What is GERD? Gastroesophageal reflux disease (GERD) is gastroesophageal reflux diseasewhich occurs when the lower esophageal sphincter (LES) opens spontaneously, for varying periods of time, or does not close properly and stomach contents rise up into the esophagus. GER is also called acid reflux or acid regurgitation, because digestive juices--called acids--rise up with the food. The esophagus is the tube that carries food from the mouth to the stomach. The LES is a ring of muscle at the bottom of the esophagus that acts like a valve between the esophagus and stomach.  When acid reflux occurs, food or fluid can be tasted in the back of the mouth. When refluxed stomach acid touches the lining of the esophagus it may cause a burning sensation in the chest or throat called heartburn or acid indigestion. Occasional reflux is common. Persistent reflux that occurs more than twice a week is considered GERD, and it can eventually lead to more serious health problems. People of all ages can have GERD. Studies have shown that GERD may worsen or contribute to asthma, chronic cough, and pulmonary fibrosis.   What are the symptoms of GERD? The main symptom of GERD in adults is frequent heartburn, also called acid indigestion--burning-type pain in the lower part of the mid-chest, behind the breast bone, and in the mid-abdomen.  Not all reflux is acidic in  nature, and many patients don't have heart burn at all. Sometimes it feels like a cough (either dry or with mucus), choking sensation, asthma, shortness of breath, waking up at night, frequent throat clearing, or trouble swallowing.    What causes GERD? The reason some people develop GERD is still unclear. However, research shows that in people with GERD, the LES relaxes while the rest of the esophagus is working. Anatomical abnormalities such as a hiatal hernia may also contribute to GERD. A hiatal hernia occurs when the upper part of the stomach and the LES move above the diaphragm, the muscle wall that separates the stomach from the chest. Normally, the diaphragm helps the LES keep acid from rising up into the esophagus. When a hiatal hernia is present, acid reflux can occur more easily. A hiatal hernia can occur in people of any age and is most often a normal finding in otherwise healthy people over age 75. Most of the time, a hiatal hernia produces no symptoms.   Other factors that may contribute to GERD include - Obesity or recent weight gain - Pregnancy  - Smoking  - Diet - Certain medications  Common foods that can worsen reflux symptoms include: - carbonated beverages - artificial sweeteners - citrus fruits  - chocolate  - drinks with caffeine or alcohol  - fatty and fried foods  - garlic and onions  - mint flavorings  - spicy foods  - tomato-based foods, like spaghetti sauce, salsa, chili, and  pizza   Lifestyle Changes If you smoke, stop.  Avoid foods and beverages that worsen symptoms (see above.) Lose weight if needed.  Eat small, frequent meals.  Wear loose-fitting clothes.  Avoid lying down for 3 hours after a meal.  Raise the head of your bed 6 to 8 inches by securing wood blocks under the bedposts. Just using extra pillows will not help, but using a wedge-shaped pillow may be helpful.  Medications  H2 blockers, such as cimetidine (Tagamet HB), famotidine (Pepcid  AC), nizatidine (Axid AR), and ranitidine (Zantac 75), decrease acid production. They are available in prescription strength and over-the-counter strength. These drugs provide short-term relief and are effective for about half of those who have GERD symptoms.  Proton pump inhibitors include omeprazole (Prilosec, Zegerid), lansoprazole (Prevacid), pantoprazole (Protonix), rabeprazole (Aciphex), and esomeprazole (Nexium), which are available by prescription. Prilosec is also available in over-the-counter strength. Proton pump inhibitors are more effective than H2 blockers and can relieve symptoms and heal the esophageal lining in almost everyone who has GERD.  Because drugs work in different ways, combinations of medications may help control symptoms. People who get heartburn after eating may take both antacids and H2 blockers. The antacids work first to neutralize the acid in the stomach, and then the H2 blockers act on acid production. By the time the antacid stops working, the H2 blocker will have stopped acid production. Your health care provider is the best source of information about how to use medications for GERD.   Points to Remember 1. You can have GERD without having heartburn. Your symptoms could include a dry cough, asthma symptoms, or trouble swallowing.  2. Taking medications daily as prescribed is important in controlling you symptoms.  Sometimes it can take up to 8 weeks to fully achieve the effects of the medications prescribed.  3. Coughing related to GERD can be difficult to treat and is very frustrating!  However, it is important to stick with these medications and lifestyle modifications before pursuing more aggressive or invasive test and treatments.

## 2022-07-23 ENCOUNTER — Telehealth: Payer: Self-pay | Admitting: Internal Medicine

## 2022-07-23 NOTE — Telephone Encounter (Signed)
Pt called in bc her oxygen levels went down to 82 yesterday

## 2022-07-25 NOTE — Telephone Encounter (Signed)
Spoke with patient regarding O2 dropping at night .Advised patient that is next time her O2 drops past 88 that she will need to go to the ED.Patient is picking up her sleep study machine on 07/26/22 and made a f/u with Beth on 08/08/22.Patient's voice was understanding.Nothing else further needed.

## 2022-08-08 ENCOUNTER — Ambulatory Visit: Payer: Medicaid Other | Admitting: Primary Care

## 2022-08-22 NOTE — Progress Notes (Deleted)
@Patient  ID: Angela Kent, female    DOB: 11/04/1985, 37 y.o.   MRN: [id number]  No chief complaint on file.   Referring provider: Dion Saucier, PA-C  HPI:   08/23/2022 Presents today for acute office visit due to nocturnal hypoxia. She reports having low oxygen levels at night. She was seen in May by Dr. Celine Mans for shortness of breath and started on [facility] for presumed asthma.  She was also ordered for pulmonary function testing and home sleep study which is not yet been completed.     Allergies  Allergen Reactions   Codeine Hives    Immunization History  Administered Date(s) Administered   Influenza Split 02/02/2014   Tdap 02/02/2014, 01/20/2015    Past Medical History:  Diagnosis Date   Acid reflux    Anxiety    Asthma    Bladder injury    bladder perferated during hysterectomy surgery. Partial bladder removal.   Complication of anesthesia    slow to wake up   Depression    History of bladder infections    progressed to kidney infections.   Stomach ulcer     Tobacco History: Social History   Tobacco Use  Smoking Status Former   Packs/day: 1.00   Years: 15.00   Additional pack years: 0.00   Total pack years: 15.00   Types: Cigarettes   Quit date: 12/2020   Years since quitting: 1.6  Smokeless Tobacco Never   Counseling given: Not Answered   Outpatient Medications Prior to Visit  Medication Sig Dispense Refill   acetaminophen (TYLENOL) 500 MG tablet Take 2 tablets (1,000 mg total) by mouth 3 (three) times daily. 30 tablet 0   busPIRone (BUSPAR) 15 MG tablet Take 15 mg by mouth 3 (three) times daily.     cetirizine (ZYRTEC) 10 MG tablet Take 10 mg by mouth daily.     docusate sodium (COLACE) 100 MG capsule Take 1 capsule (100 mg total) by mouth 2 (two) times daily. 30 capsule 0   hydrOXYzine (VISTARIL) 25 MG capsule Take 25 mg by mouth daily.     mometasone-formoterol (DULERA) 100-5 MCG/ACT AERO Inhale 2 puffs into the lungs in the morning and at  bedtime. 1 each 3   omeprazole (PRILOSEC) 40 MG capsule Take 40 mg by mouth 2 (two) times daily.     VENTOLIN HFA 108 (90 Base) MCG/ACT inhaler Inhale 2 puffs into the lungs every 4 (four) hours as needed.     No facility-administered medications prior to visit.      Review of Systems  Review of Systems   Physical Exam  There were no vitals taken for this visit. Physical Exam   Lab Results:  CBC    Component Value Date/Time   WBC 11.5 (H) 12/02/2021 0250   RBC 3.59 (L) 12/02/2021 0250   HGB 11.0 (L) 12/02/2021 0250   HCT 33.4 (L) 12/02/2021 0250   PLT 212 12/02/2021 0250   MCV 93.0 12/02/2021 0250   MCH 30.6 12/02/2021 0250   MCHC 32.9 12/02/2021 0250   RDW 12.8 12/02/2021 0250   LYMPHSABS 2,324 02/19/2019 1021   EOSABS 299 02/19/2019 1021   BASOSABS 33 02/19/2019 1021    BMET    Component Value Date/Time   NA 135 12/02/2021 1052   K 3.9 12/02/2021 1052   CL 103 12/02/2021 1052   CO2 25 12/02/2021 1052   GLUCOSE 96 12/02/2021 1052   BUN <5 (L) 12/02/2021 1052   CREATININE 0.48 12/02/2021 1052  CREATININE 0.58 02/19/2019 1021   CALCIUM 7.7 (L) 12/02/2021 1052   GFRNONAA >60 12/02/2021 1052   GFRNONAA 123 02/19/2019 1021   GFRAA 142 02/19/2019 1021    BNP No results found for: "BNP"  ProBNP No results found for: "PROBNP"  Imaging: No results found.   Assessment & Plan:   No problem-specific Assessment & Plan notes found for this encounter.     Glenford Bayley, NP 08/22/2022

## 2022-08-23 ENCOUNTER — Ambulatory Visit: Payer: Medicaid Other | Admitting: Primary Care

## 2022-09-05 ENCOUNTER — Other Ambulatory Visit: Payer: Self-pay | Admitting: Nurse Practitioner

## 2022-09-05 DIAGNOSIS — Z803 Family history of malignant neoplasm of breast: Secondary | ICD-10-CM

## 2022-09-05 DIAGNOSIS — Z9189 Other specified personal risk factors, not elsewhere classified: Secondary | ICD-10-CM

## 2022-09-17 ENCOUNTER — Encounter (HOSPITAL_BASED_OUTPATIENT_CLINIC_OR_DEPARTMENT_OTHER): Payer: Self-pay

## 2022-09-18 ENCOUNTER — Encounter (HOSPITAL_BASED_OUTPATIENT_CLINIC_OR_DEPARTMENT_OTHER): Payer: Self-pay

## 2022-09-24 ENCOUNTER — Ambulatory Visit: Payer: Medicaid Other | Admitting: Internal Medicine

## 2022-09-24 ENCOUNTER — Encounter (HOSPITAL_BASED_OUTPATIENT_CLINIC_OR_DEPARTMENT_OTHER): Payer: Medicaid Other

## 2022-10-08 ENCOUNTER — Ambulatory Visit: Payer: Medicaid Other | Admitting: Internal Medicine

## 2022-10-25 ENCOUNTER — Encounter: Payer: Self-pay | Admitting: Nurse Practitioner

## 2022-11-02 ENCOUNTER — Ambulatory Visit
Admission: RE | Admit: 2022-11-02 | Discharge: 2022-11-02 | Disposition: A | Discharge status: Home / Self Care | Payer: Medicaid Other | Source: Ambulatory Visit | Attending: Nurse Practitioner | Admitting: Nurse Practitioner

## 2022-11-02 DIAGNOSIS — Z9189 Other specified personal risk factors, not elsewhere classified: Secondary | ICD-10-CM

## 2022-11-02 DIAGNOSIS — Z803 Family history of malignant neoplasm of breast: Secondary | ICD-10-CM

## 2022-11-02 MED ORDER — GADOPICLENOL 0.5 MMOL/ML IV SOLN
10.0000 mL | Freq: Once | INTRAVENOUS | Status: AC | PRN
  Administered 2022-11-02: 10 mL via INTRAVENOUS
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
## 2023-10-25 ENCOUNTER — Inpatient Hospital Stay

## 2023-10-25 ENCOUNTER — Inpatient Hospital Stay: Admitting: Hematology and Oncology

## 2023-11-12 ENCOUNTER — Inpatient Hospital Stay: Attending: Hematology and Oncology | Admitting: Hematology and Oncology

## 2023-11-12 ENCOUNTER — Inpatient Hospital Stay

## 2023-11-12 VITALS — BP 92/66 | HR 92 | Temp 97.9°F | Resp 17 | Wt 210.5 lb

## 2023-11-12 DIAGNOSIS — Z87891 Personal history of nicotine dependence: Secondary | ICD-10-CM | POA: Diagnosis not present

## 2023-11-12 DIAGNOSIS — Z1379 Encounter for other screening for genetic and chromosomal anomalies: Secondary | ICD-10-CM | POA: Insufficient documentation

## 2023-11-12 DIAGNOSIS — Z79899 Other long term (current) drug therapy: Secondary | ICD-10-CM | POA: Insufficient documentation

## 2023-11-12 DIAGNOSIS — Z1589 Genetic susceptibility to other disease: Secondary | ICD-10-CM

## 2023-11-12 DIAGNOSIS — Z801 Family history of malignant neoplasm of trachea, bronchus and lung: Secondary | ICD-10-CM | POA: Insufficient documentation

## 2023-11-12 DIAGNOSIS — Z803 Family history of malignant neoplasm of breast: Secondary | ICD-10-CM | POA: Insufficient documentation

## 2023-11-12 DIAGNOSIS — K219 Gastro-esophageal reflux disease without esophagitis: Secondary | ICD-10-CM | POA: Diagnosis not present

## 2023-11-12 DIAGNOSIS — K259 Gastric ulcer, unspecified as acute or chronic, without hemorrhage or perforation: Secondary | ICD-10-CM | POA: Insufficient documentation

## 2023-11-12 NOTE — Progress Notes (Signed)
 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 Tyrer-Cusick risk score just about 12%. Low breast density. No annual MRI screening indicated. - Continue annual mammograms. - Perform monthly self-breast exams. - Refer to genetic counseling for ATM VUS clarification. She doesn't have a known pathogenic mutation, so I don't think she needs to see us  in the high risk breast clinic, given her life time risk and since she will continue annual mammograms at her PCP's office. - Encourage lifestyle modifications: healthy BMI, plant-based diet, regular exercise.  Gastroesophageal reflux disease and recurrent gastric ulcers GERD and recurrent gastric ulcers managed with medication.  History of tobacco use Quit smoking in 2022. Active lifestyle maintained. - Encourage continued abstinence from smoking.  HISTORY OF PRESENTING ILLNESS:  Angela Kent 38 y.o. 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  History of Present Illness Angela Kent is a 38 year old female who presents for evaluation of genetic testing results due to a family history of breast cancer.  She has a significant family history of breast cancer, with her paternal aunt diagnosed at ages 60 and 24, and her paternal grandmother also having had breast cancer, though the age of diagnosis is unclear. This family history prompted genetic testing.  In 2023, she underwent genetic testing which revealed a variant of unknown significance  (VUS) in the ATM gene, but no pathogenic mutations were identified. She has not yet seen a Dentist, only her gynecologist.  She has had one MRI due to a lump and undergoes annual mammograms. She has not been diagnosed with breast cancer herself. No personal history of breast cancer, diabetes, high blood pressure, ovarian cancer, or other cancers. No history of breast biopsy or Ashkenazi Jewish heritage.  Her personal medical history includes significant acid reflux and frequent stomach ulcers, for which she has undergone multiple esophagogastroduodenoscopies (EGDs). She quit smoking in 2022 and does not consume alcohol.  Her father has prostate cancer, but there is no known history of BRCA testing in the family. Her mother is alive and healthy, and her sister, who is 50, has tested negative for BRCA mutations.   All other systems were reviewed with the patient and are negative.  MEDICAL HISTORY:  Past Medical History:  Diagnosis Date   Acid reflux    Anxiety    Asthma    Bladder injury    bladder perferated during hysterectomy surgery. Partial bladder removal.   Complication of anesthesia    slow to wake up   Depression    History of bladder infections    progressed to kidney infections.   Stomach ulcer     SURGICAL HISTORY: Past Surgical History:  Procedure Laterality Date   APPENDECTOMY     BIOPSY  09/25/2018   Procedure: BIOPSY;  Surgeon: Golda Claudis PENNER, MD;  [facility];  Service: Endoscopy;;   BIOPSY  02/23/2020   Procedure: BIOPSY;  Surgeon: Eartha Angelia Sieving, MD;  [facility];  Service: Gastroenterology;;   BIOPSY  05/24/2020  Procedure: BIOPSY;  Surgeon: Eartha Angelia Sieving, MD;  [facility];  Service: Gastroenterology;;   BIOPSY  08/26/2020   Procedure: BIOPSY;  Surgeon: Eartha Angelia Sieving, MD;  [facility];  Service: Gastroenterology;;   BIOPSY  10/18/2020   Procedure: BIOPSY;  Surgeon:  Eartha Angelia Sieving, MD;  [facility];  Service: Gastroenterology;;   BIOPSY  07/11/2021   Procedure: BIOPSY;  Surgeon: Eartha Angelia Sieving, MD;  [facility];  Service: Gastroenterology;;   BLADDER REPAIR  2019   at [facility], repair of bladder after hysterectomy surgery.   CESAREAN SECTION     CHOLECYSTECTOMY     ESOPHAGEAL DILATION N/A 02/23/2020   Procedure: ESOPHAGEAL DILATION;  Surgeon: Eartha Angelia Sieving, MD;  [facility];  Service: Gastroenterology;  Laterality: N/A;   ESOPHAGOGASTRODUODENOSCOPY N/A 09/25/2018   Procedure: ESOPHAGOGASTRODUODENOSCOPY (EGD);  Surgeon: Golda Claudis PENNER, MD;  [facility];  Service: Endoscopy;  Laterality: N/A;  1200   ESOPHAGOGASTRODUODENOSCOPY (EGD) WITH PROPOFOL  N/A 02/23/2020   Procedure: ESOPHAGOGASTRODUODENOSCOPY (EGD) WITH PROPOFOL ;  Surgeon: Eartha Angelia Sieving, MD;  [facility];  Service: Gastroenterology;  Laterality: N/A;  9:30   ESOPHAGOGASTRODUODENOSCOPY (EGD) WITH PROPOFOL  N/A 05/24/2020   Procedure: ESOPHAGOGASTRODUODENOSCOPY (EGD) WITH PROPOFOL ;  Surgeon: Eartha Angelia Sieving, MD;  [facility];  Service: Gastroenterology;  Laterality: N/A;  AM   ESOPHAGOGASTRODUODENOSCOPY (EGD) WITH PROPOFOL  N/A 08/26/2020   Procedure: ESOPHAGOGASTRODUODENOSCOPY (EGD) WITH PROPOFOL ;  Surgeon: Eartha Angelia Sieving, MD;  [facility];  Service: Gastroenterology;  Laterality: N/A;  1:45   ESOPHAGOGASTRODUODENOSCOPY (EGD) WITH PROPOFOL  N/A 10/18/2020   Procedure: ESOPHAGOGASTRODUODENOSCOPY (EGD) WITH PROPOFOL ;  Surgeon: Eartha Angelia Sieving, MD;  [facility];  Service: Gastroenterology;  Laterality: N/A;  12:00   ESOPHAGOGASTRODUODENOSCOPY (EGD) WITH PROPOFOL  N/A 07/11/2021   Procedure: ESOPHAGOGASTRODUODENOSCOPY (EGD) WITH PROPOFOL ;  Surgeon: Eartha Angelia Sieving, MD;  [facility];  Service: Gastroenterology;   Laterality: N/A;  915 ASA 1   GASTROJEJUNOSTOMY N/A 11/29/2021   Procedure: ROUX-EN-Y GASTROJEJUNOSTOMY;  Surgeon: Dasie Leonor CROME, MD;  Location: [facility];  Service: General;  Laterality: N/A;   OPEN REDUCTION INTERNAL FIXATION (ORIF) SCAPHOID WITH DISTAL RADIUS GRAFT Left 04/27/2021   Procedure: OPEN REDUCTION INTERNAL FIXATION (ORIF) LEFT SCAPHOID WITH BONE GRAFT;  Surgeon: Murrell Drivers, MD;  Location: [facility];  Service: Orthopedics;  Laterality: Left;   PARTIAL GASTRECTOMY N/A 11/29/2021   Procedure: OPEN DISTAL GASTRECTOMY;  Surgeon: Dasie Leonor CROME, MD;  Location: [facility] OR;  Service: General;  Laterality: N/A;   PARTIAL HYSTERECTOMY     TONSILLECTOMY      SOCIAL HISTORY: Social History   Socioeconomic History   Marital status: Married    Spouse name: Not on file   Number of children: 3   Years of education: Not on file   Highest education level: Not on file  Occupational History   Not on file  Tobacco Use   Smoking status: Former    Current packs/day: 0.00    Average packs/day: 1 pack/day for 15.0 years (15.0 ttl pk-yrs)    Types: Cigarettes    Start date: 12/2005    Quit date: 12/2020    Years since quitting: 2.9   Smokeless tobacco: Never  Vaping Use   Vaping status: Never Used  Substance and Sexual Activity   Alcohol use: Never   Drug use: Never   Sexual activity: Not on file  Other Topics Concern  Not on file  Social History Narrative   Not on file   Social Drivers of Health   Financial Resource Strain: Not on file  Food Insecurity: No Food Insecurity (09/12/2023)   Received from [facility]   Hunger Vital Sign    Within the past 12 months, you worried that your food would run out before you got the money to buy more.: Never true    Within the past 12 months, the food you bought just didn't last and you didn't have money to get more.: Never true  Transportation Needs: No Transportation Needs (09/12/2023)   Received from [facility] - Transportation    Lack of Transportation (Medical): No    Lack of Transportation (Non-Medical): No  Physical Activity: Not on file  Stress: Not on file  Social Connections: Unknown (06/12/2021)   Received from [facility]   Social Network    Social Network: Not on file  Intimate Partner Violence: Not At Risk (09/12/2023)   Received from [facility]   Humiliation, Afraid, Rape, and Kick questionnaire    Within the last year, have you been afraid of your partner or ex-partner?: No    Within the last year, have you been humiliated or emotionally abused in other ways by your partner or ex-partner?: No    Within the last year, have you been kicked, hit, slapped, or otherwise physically hurt by your partner or ex-partner?: No    Within the last year, have you been raped or forced to have any kind of sexual activity by your partner or ex-partner?: No    FAMILY HISTORY: Family History  Problem Relation Age of Onset   COPD Father    Cancer - Lung Paternal Grandfather     ALLERGIES:  is allergic to codeine.  MEDICATIONS:  Current Outpatient Medications  Medication Sig Dispense Refill   acetaminophen  (TYLENOL ) 500 MG tablet Take 2 tablets (1,000 mg total) by mouth 3 (three) times daily. 30 tablet 0   busPIRone  (BUSPAR ) 15 MG tablet Take 15 mg by mouth 3 (three) times daily.     cetirizine (ZYRTEC) 10 MG tablet Take 10 mg by mouth daily.     docusate sodium  (COLACE) 100 MG capsule Take 1 capsule (100 mg total) by mouth 2 (two) times daily. 30 capsule 0   hydrOXYzine  (VISTARIL ) 25 MG capsule Take 25 mg by mouth daily.     mometasone-formoterol (DULERA ) 100-5 MCG/ACT AERO Inhale 2 puffs into the lungs in the morning and at bedtime. 1 each 3   omeprazole  (PRILOSEC) 40 MG capsule Take 40 mg by mouth 2 (two) times daily.     VENTOLIN HFA 108 (90 Base) MCG/ACT inhaler Inhale 2 puffs into the lungs every 4 (four) hours as needed.     No current facility-administered medications for  this visit.     PHYSICAL EXAMINATION: ECOG PERFORMANCE STATUS: 0 - Asymptomatic  Vitals:   11/12/23 1112  BP: 92/66  Pulse: 92  Resp: 17  Temp: 97.9 F (36.6 C)  SpO2: 98%   Filed Weights   11/12/23 1112  Weight: 210 lb 8 oz (95.5 kg)    GENERAL:alert, no distress and comfortable No cervical adenopathy Bilateral breasts inspected and palpated. No palpable masses No regional adenopathy CTA bilaterally RRR No LE edema.  LABORATORY DATA:  I have reviewed the data as listed Lab Results  Component Value Date   WBC 11.5 (H) 12/02/2021   HGB 11.0 (L) 12/02/2021  HCT 33.4 (L) 12/02/2021   MCV 93.0 12/02/2021   PLT 212 12/02/2021     Chemistry      Component Value Date/Time   NA 135 12/02/2021 1052   K 3.9 12/02/2021 1052   CL 103 12/02/2021 1052   CO2 25 12/02/2021 1052   BUN <5 (L) 12/02/2021 1052   CREATININE 0.48 12/02/2021 1052   CREATININE 0.58 02/19/2019 1021      Component Value Date/Time   CALCIUM 7.7 (L) 12/02/2021 1052   ALKPHOS 28 (L) 12/25/2020 1623   AST 25 12/25/2020 1623   ALT 18 12/25/2020 1623   BILITOT 0.4 12/25/2020 1623       RADIOGRAPHIC STUDIES: I have personally reviewed the radiological images as listed and agreed with the findings in the report. No results found.  All questions were answered. The patient knows to call the clinic with any problems, questions or concerns. I spent 45 minutes in the care of this patient including H and P, review of records, counseling and coordination of care.     Amber Stalls, MD 11/12/2023 6:15 PM

## 2024-01-22 ENCOUNTER — Inpatient Hospital Stay: Attending: Hematology and Oncology | Admitting: Genetic Counselor

## 2024-01-22 ENCOUNTER — Telehealth: Payer: Self-pay | Admitting: Genetic Counselor

## 2024-01-22 ENCOUNTER — Inpatient Hospital Stay

## 2024-01-22 NOTE — Telephone Encounter (Signed)
 Calling to determine if Angela Kent is coming to genetic counseling appointment to discuss amended report. No answer, voicemail full. Unable to leave message.

## 2024-01-22 NOTE — Telephone Encounter (Signed)
 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 Amended report was not included in the referral. 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 The CDH1 variant remains a VUS.   Per [facility], variant was amended to likely pathogenic given data from pheno, a family history weighing algorithm. This algorithm was able to evaluate 167 samples where the ATM c.5882A>G (p.Tyr1961Cys) variant was identified, and algorithm determine pattern of variant with family histories (segregating with disease) was significant enough to suggest pathogenicity. Additional line of evidence was sufficient to reclassify variant as likely pathogenic per [facility]. However, multiple other labs are still classifying it as a variant of unknown significance.  Additionally, per [facility] there is uncertainty as to whether this particular ATM variant would be sufficient to cause ataxia telangiectasia when in trans with another pathogenic variant. This is likely due to the fact that this variant appears to be associated with reduced protein expression, not complete absence. Suggestive of a hypomorphic allele, although [facility] is not explicitly calling it a hypomorphic allele in their report per [facility].   Standard ATM recommendations apply at this time to this variant per [facility].
